# Patient Record
Sex: Female | Born: 1980 | Race: White | Hispanic: No | Smoking: Current every day smoker
Health system: Southern US, Community
[De-identification: ages and names within clinical notes are randomized; demographics above are authoritative.]

## PROBLEM LIST (undated history)

## (undated) ENCOUNTER — Inpatient Hospital Stay (HOSPITAL_COMMUNITY): Payer: Self-pay

## (undated) ENCOUNTER — Inpatient Hospital Stay (HOSPITAL_COMMUNITY): Payer: 59

## (undated) DIAGNOSIS — E039 Hypothyroidism, unspecified: Secondary | ICD-10-CM

## (undated) DIAGNOSIS — F419 Anxiety disorder, unspecified: Secondary | ICD-10-CM

## (undated) DIAGNOSIS — E079 Disorder of thyroid, unspecified: Secondary | ICD-10-CM

## (undated) DIAGNOSIS — K219 Gastro-esophageal reflux disease without esophagitis: Secondary | ICD-10-CM

## (undated) DIAGNOSIS — N760 Acute vaginitis: Secondary | ICD-10-CM

## (undated) DIAGNOSIS — F329 Major depressive disorder, single episode, unspecified: Secondary | ICD-10-CM

## (undated) DIAGNOSIS — C801 Malignant (primary) neoplasm, unspecified: Secondary | ICD-10-CM

## (undated) DIAGNOSIS — B9689 Other specified bacterial agents as the cause of diseases classified elsewhere: Secondary | ICD-10-CM

## (undated) DIAGNOSIS — R87619 Unspecified abnormal cytological findings in specimens from cervix uteri: Secondary | ICD-10-CM

## (undated) DIAGNOSIS — F32A Depression, unspecified: Secondary | ICD-10-CM

## (undated) DIAGNOSIS — J40 Bronchitis, not specified as acute or chronic: Secondary | ICD-10-CM

## (undated) HISTORY — PX: TONSILLECTOMY: SUR1361

## (undated) HISTORY — DX: Disorder of thyroid, unspecified: E07.9

## (undated) HISTORY — DX: Unspecified abnormal cytological findings in specimens from cervix uteri: R87.619

## (undated) HISTORY — PX: ABDOMINAL HYSTERECTOMY: SHX81

---
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
## 2002-10-20 ENCOUNTER — Inpatient Hospital Stay (HOSPITAL_COMMUNITY): Admission: AD | Admit: 2002-10-20 | Discharge: 2002-10-23 | Payer: Self-pay | Admitting: Obstetrics and Gynecology

## 2003-01-02 ENCOUNTER — Other Ambulatory Visit: Admission: RE | Admit: 2003-01-02 | Discharge: 2003-01-02 | Payer: Self-pay | Admitting: Obstetrics and Gynecology

## 2004-07-03 ENCOUNTER — Ambulatory Visit: Payer: Self-pay | Admitting: Internal Medicine

## 2004-07-11 ENCOUNTER — Ambulatory Visit: Payer: Self-pay | Admitting: Internal Medicine

## 2004-07-15 ENCOUNTER — Ambulatory Visit: Payer: Self-pay | Admitting: Nurse Practitioner

## 2004-07-16 ENCOUNTER — Ambulatory Visit: Payer: Self-pay | Admitting: *Deleted

## 2006-12-22 ENCOUNTER — Emergency Department (HOSPITAL_COMMUNITY): Admission: EM | Admit: 2006-12-22 | Discharge: 2006-12-22 | Payer: Self-pay | Admitting: Emergency Medicine

## 2006-12-27 ENCOUNTER — Encounter: Admission: RE | Admit: 2006-12-27 | Discharge: 2006-12-27 | Payer: Self-pay | Admitting: Chiropractic Medicine

## 2007-02-08 ENCOUNTER — Emergency Department (HOSPITAL_COMMUNITY): Admission: EM | Admit: 2007-02-08 | Discharge: 2007-02-08 | Payer: Self-pay | Admitting: Emergency Medicine

## 2009-06-04 DIAGNOSIS — R87619 Unspecified abnormal cytological findings in specimens from cervix uteri: Secondary | ICD-10-CM

## 2009-06-04 HISTORY — DX: Unspecified abnormal cytological findings in specimens from cervix uteri: R87.619

## 2009-06-04 HISTORY — PX: COLPOSCOPY: SHX161

## 2009-06-14 HISTORY — PX: INTRAUTERINE DEVICE INSERTION: SHX323

## 2010-08-22 NOTE — Op Note (Signed)
NAME:  Sara Shannon, Sara Shannon                        ACCOUNT NO.:  [id number]   MEDICAL RECORD NO.:  [id number]                   PATIENT TYPE:  INP   LOCATION:  9174                                 FACILITY:  [facility]   PHYSICIAN:  Hal Morales, M.D.             DATE OF BIRTH:  Apr 07, 1980   DATE OF PROCEDURE:  10/20/2002  DATE OF DISCHARGE:                                 OPERATIVE REPORT   PREOPERATIVE DIAGNOSES:  1. Intrauterine pregnancy at term.  2. Failure to progress in labor.  3. Arrest of the active phase of labor.   POSTOPERATIVE DIAGNOSES:  1. Intrauterine pregnancy at term.  2. Failure to progress in labor.  3. Arrest of the active phase of labor.   OPERATION/PROCEDURE:  Primary low transverse cesarean section.   SURGEON:  Hal Morales, M.D.   FIRST ASSISTANT:  Renaldo Reel. Emilee Hero, C.N.M.   ANESTHESIA:  Epidural.   ESTIMATED BLOOD LOSS:  250 mL.   COMPLICATIONS:  None.   FINDINGS:  The patient was delivered of a female infant whose name is  Jeri Modena, weighing 8 pounds 4 ounces with Apgars of 9 and 9 at one and five  minutes respectively.  The uterus, tubes and ovaries were normal for the  gravid state.  Discussion was held with the patient, father of the baby,  concerning the prolonged, active phase of labor with the patient having  remained at 8 cm for a number of hours in spite of adequate labor.  The  risks of anesthesia then explained in detail and the patient acknowledged  understanding of those risks and wished to proceed with cesarean section.   DESCRIPTION OF PROCEDURE:  The patient was placed in the supine position  with a left lateral tilt.  The abdomen was prepped with multiple layers of  Betadine and draped in the sterile field.  After assurance of adequate  anesthesia, a injection of 0.25% Marcaine for a total of 10 mL was  undertaken in the suprapubic region.  A suprapubic incision was made on the  abdomen, and opened in layers.  The peritoneum was  entered and the bladder  blade placed.  The uterus was incised approximately 2 cm above the  uterovesical folds and the infant delivered from the occiput transverse  position with the aid of a QV vacuum extractor.  The nares and pharynx was  suctioned and the cord clamped and cut.  The infant was handed off to the  awaiting pediatricians.  The appropriate cord blood was drawn.  The placenta  was noted to have separated from the uterus and was removed from the  operative field.  Uterine incision was closed with running interlocking  sutures of 0 Vicryl.  An imbricating suture of 0 Vicryl was placed.  Hemostasis was noted to be adequate.  Copious irrigation was carried out and  the abdominal peritoneum was closed with a running suture of 2-0 Vicryl.  The rectus muscles were reapproximated in the midline with a figure-of-eight  suture of 2-0 Vicryl.  The rectus muscles were made hemostatic with Bovie  cautery and irrigated.  The rectus fascia was closed with a running suture  of 0 Vicryl, then reinforced on either side of midline with figure-of-eight  sutures of 0 Vicryl.  The subcutaneous tissue was irrigated and made  hemostatic with Bovie cautery.  Skin staples were applied to the skin  incision.  A sterile dressing was applied.  The patient was taken from the  operating room to the recovery room in satisfactory condition having  tolerated the procedure well with sponge and instrument counts correct.  The  infant went to the full-term nursery.                                                  Hal Morales, M.D.    VPH/MEDQ  D:  10/20/2002  T:  10/22/2002  Job:  [id number]

## 2010-08-22 NOTE — H&P (Signed)
NAME:  Sara Shannon, Sara Shannon                        ACCOUNT NO.:  [id number]   MEDICAL RECORD NO.:  [id number]                   PATIENT TYPE:  INP   LOCATION:  9174                                 FACILITY:  [facility]   PHYSICIAN:  Hal Morales, M.D.             DATE OF BIRTH:  04/19/1980   DATE OF ADMISSION:  10/20/2002  DATE OF DISCHARGE:                                HISTORY & PHYSICAL   This is a 30 year old gravida 1, para 0, at 39-4/7 weeks who presents with  regular uterine contractions for two hours.  The cervix was 1 cm in the  office earlier today.  She denies leaking or bleeding and reports positive  fetal movement.  She did have some bleeding earlier today.  Pregnancy has  been followed by the nurse midwife service and remarkable for:   1. First trimester Chlamydia.  2. First trimester UTI.  3. History of questionable hypothyroidism.  4. Smoker.  5. Group B strep positive.    PAST OBSTETRICAL HISTORY:  The patient is a primigravida.   PRENATAL LABORATORY DATA:  Hemoglobin 13.1, platelets 253.  Blood type A  positive.  Antibody screen negative.  RPR nonreactive.  Rubella immune.  HBsAg negative.  HIV nonreactive.  Pap test normal.  Gonorrhea negative.  Chlamydia negative.  TSH within normal limits.  Glucose challenge within  normal limits.  Quad screen within normal limits.  Group B strep __________.   PAST MEDICAL HISTORY:  1. Remarkable for history of abnormal Pap smear in 2001 with normal repeat     Pap smears.  2. History of  STDs in the past.  3. Childhood Varicella.  4. Questionable history of hypothyroidism, for which she used Synthroid     while in seventh grade but then later stopped the drug and was informed     that her TSH was normal, so she has been off the medication since then.  5. She also has a history of frequent cystitis.  6. The patient is also a smoker of one pack per day.   PAST SURGICAL HISTORY:  Remarkable for tonsillectomy.   FAMILY  HISTORY:  Remarkable for emphysema in her grandmother, lung cancer in  her grandfather, a stroke in her father, alcohol use in her father.   GENETIC HISTORY:  Remarkable for father of the baby's great-uncle with  mental retardation.   SOCIAL HISTORY:  The patient is single but involved with [facility], who  is involved and supportive.  She is of the [employer].  She denies any  alcohol or drug use but does smoke one pack per day.   OBJECTIVE DATA:  VITAL SIGNS:  Stable, afebrile.  HEENT:  Within normal limits.  NECK:  Thyroid normal, not enlarged.  CHEST:  Clear to auscultation.  CARDIAC:  Regular rate and rhythm.  ABDOMEN:  Gravid at 40 cm vertex to Leopold's.  EFM:  She has a reassuring  fetal heart rate with uterine contractions every two to three minutes.  PELVIC:  Cervical exam 4, 90%, and -2 per R.N.  EXTREMITIES:  Within normal limits.   ASSESSMENT:  1. Intrauterine pregnancy at 39-4/7 weeks.  2. Active labor.   PLAN:  1. Admit to birthing suite, Dr. Pennie Rushing notified.  2. Routine CNM orders.  3. IV analgesia per patient request.     Elby Showers. Williams, C.N.M.                 Hal Morales, M.D.    MLW/MEDQ  D:  10/20/2002  T:  10/20/2002  Job:  [id number]

## 2010-08-22 NOTE — Discharge Summary (Signed)
NAME:  Sara Shannon, Sara Shannon                        ACCOUNT NO.:  [id number]   MEDICAL RECORD NO.:  [id number]                   PATIENT TYPE:  INP   LOCATION:  9123                                 FACILITY:  [facility]   PHYSICIAN:  Crist Fat. Rivard, M.D.              DATE OF BIRTH:  1980/10/13   DATE OF ADMISSION:  10/20/2002  DATE OF DISCHARGE:  10/23/2002                                 DISCHARGE SUMMARY   ADMISSION DIAGNOSES:  1. Intrauterine pregnancy at 21 and 4/7ths weeks.  2. Active labor.  3. Group B strep negative.   DISCHARGE DIAGNOSES:  1. Intrauterine pregnancy at 38 and 4/7ths weeks.  2. Active labor.  3. Group B strep negative.  4. Variable decelerations.  5. Failure to progress.   HOSPITAL PROCEDURES:  1. Epidural anesthesia.  2. Pitocin augmentation.  3. Amnioinfusion.  4. Primary low transverse cesarean section of a female infant names Jeri Modena     weighing 8 pounds, 4 ounces, Apgars 9/9.   HOSPITAL COURSE:  The patient was admitted in early active labor.  She  progressed throughout the day to 6 cm, and then an hour later to 7-8 cm.  After that time, her cervix remained at 8 cm for several more hours.  Later  that evening, the patient had experienced adequate labor with no change in  her cervix, which was 8 cm and 0 to +1 station with apparent asynchronism.  Risks and benefits of cesarean section were reviewed with the patient, and  the patient elected to proceed with a primary low transverse cesarean  section for failure to progress.  This was performed under epidural  anesthesia by Dr. Pennie Rushing with no complications.  Infant and mother were  taken to recovery and then to the mother baby unit.  On postoperative day  #1, the patient was doing well.  She did have some inability to void in the  first hours of day #1, which was relieved by forcing fluids and ending up  catheterization x1.  On postoperative day #2, she was doing much better, she  was voiding sufficiently,  she had two bowel movements, she was up and around  with no problems, vital signs were stable, and she continued her  postoperative care.  On postoperative day #3, she was requesting to go home,  vital signs were stable, and she was afebrile.  Chest clear to auscultation,  heart rate regular, abdomen was soft and appropriately tender, the incision  was clean, intact, and stable, extremities within normal limits, lochia  small to moderate.  She was deemed to have received full benefit of her  hospital stay and was discharged home.   DISCHARGE MEDICATIONS:  1. Motrin 600 mg p.o. q.6h. p.r.n. pain.  2. Tylox 1-2 p.o. q.4h. p.r.n. pain.    DISCHARGE LABORATORIES:  White blood cell count 13.8, hemoglobin 10.1,  platelets 138, RPR nonreactive.   DISCHARGE INSTRUCTIONS:  Received  OB handout.  Discharge follow up in six  weeks or p.r.n.     Marie L. Williams, C.N.M.                 Crist Fat Rivard, M.D.    MLW/MEDQ  D:  10/23/2002  T:  10/23/2002  Job:  [id number]

## 2011-01-13 LAB — DIFFERENTIAL
Basophils Absolute: 0
Eosinophils Absolute: 0.1
Eosinophils Relative: 1
Lymphocytes Relative: 14
Monocytes Absolute: 0.7
Monocytes Relative: 9

## 2011-01-13 LAB — I-STAT 8, (EC8 V) (CONVERTED LAB)
Hemoglobin: 15.6 — ABNORMAL HIGH
TCO2: 29

## 2011-01-13 LAB — CBC
HCT: 41.9
Hemoglobin: 14.3
MCHC: 34.2
MCV: 97.7
Platelets: 295
RBC: 4.29

## 2011-01-13 LAB — POCT I-STAT CREATININE: Creatinine, Ser: 1

## 2011-01-15 LAB — POCT URINALYSIS DIP (DEVICE)
Operator id: 116391
Protein, ur: >=300 — AB

## 2011-01-15 LAB — POCT PREGNANCY, URINE
Operator id: 116391
Preg Test, Ur: NEGATIVE

## 2013-10-07 ENCOUNTER — Encounter (HOSPITAL_COMMUNITY): Payer: Self-pay | Admitting: Emergency Medicine

## 2013-10-07 ENCOUNTER — Emergency Department (HOSPITAL_COMMUNITY): Payer: Self-pay

## 2013-10-07 ENCOUNTER — Emergency Department (HOSPITAL_COMMUNITY)
Admission: EM | Admit: 2013-10-07 | Discharge: 2013-10-08 | Disposition: A | Discharge status: Home / Self Care | Payer: Self-pay | Attending: Emergency Medicine | Admitting: Emergency Medicine

## 2013-10-07 DIAGNOSIS — Z3202 Encounter for pregnancy test, result negative: Secondary | ICD-10-CM | POA: Insufficient documentation

## 2013-10-07 DIAGNOSIS — F17200 Nicotine dependence, unspecified, uncomplicated: Secondary | ICD-10-CM | POA: Insufficient documentation

## 2013-10-07 DIAGNOSIS — J40 Bronchitis, not specified as acute or chronic: Secondary | ICD-10-CM

## 2013-10-07 DIAGNOSIS — Z792 Long term (current) use of antibiotics: Secondary | ICD-10-CM | POA: Insufficient documentation

## 2013-10-07 DIAGNOSIS — J209 Acute bronchitis, unspecified: Secondary | ICD-10-CM | POA: Insufficient documentation

## 2013-10-07 DIAGNOSIS — Z79899 Other long term (current) drug therapy: Secondary | ICD-10-CM | POA: Insufficient documentation

## 2013-10-07 DIAGNOSIS — J069 Acute upper respiratory infection, unspecified: Secondary | ICD-10-CM | POA: Insufficient documentation

## 2013-10-07 LAB — COMPREHENSIVE METABOLIC PANEL
ALBUMIN: 4 g/dL (ref 3.5–5.2)
ALT: 10 U/L (ref 0–35)
AST: 14 U/L (ref 0–37)
Alkaline Phosphatase: 47 U/L (ref 39–117)
Anion gap: 13 (ref 5–15)
BILIRUBIN TOTAL: 0.5 mg/dL (ref 0.3–1.2)
BUN: 11 mg/dL (ref 6–23)
CHLORIDE: 102 meq/L (ref 96–112)
CO2: 25 mEq/L (ref 19–32)
CREATININE: 0.89 mg/dL (ref 0.50–1.10)
Calcium: 9.1 mg/dL (ref 8.4–10.5)
GFR calc non Af Amer: 85 mL/min — ABNORMAL LOW (ref >90)
GLUCOSE: 102 mg/dL — AB (ref 70–99)
Potassium: 3.5 mEq/L — ABNORMAL LOW (ref 3.7–5.3)
SODIUM: 140 meq/L (ref 137–147)
TOTAL PROTEIN: 6.7 g/dL (ref 6.0–8.3)

## 2013-10-07 LAB — RAPID STREP SCREEN (MED CTR MEBANE ONLY): Streptococcus, Group A Screen (Direct): NEGATIVE

## 2013-10-07 LAB — URINALYSIS, ROUTINE W REFLEX MICROSCOPIC
Bilirubin Urine: NEGATIVE
GLUCOSE, UA: NEGATIVE mg/dL
Hgb urine dipstick: NEGATIVE
Ketones, ur: 15 mg/dL — AB
NITRITE: NEGATIVE
Protein, ur: NEGATIVE mg/dL
Specific Gravity, Urine: 1.02 (ref 1.005–1.030)
UROBILINOGEN UA: 1 mg/dL (ref 0.0–1.0)
pH: 6 (ref 5.0–8.0)

## 2013-10-07 LAB — CBC WITH DIFFERENTIAL/PLATELET
BASOS ABS: 0 10*3/uL (ref 0.0–0.1)
Basophils Relative: 0 % (ref 0–1)
EOS ABS: 0.2 10*3/uL (ref 0.0–0.7)
EOS PCT: 2 % (ref 0–5)
HCT: 40.1 % (ref 36.0–46.0)
Hemoglobin: 13.9 g/dL (ref 12.0–15.0)
Lymphocytes Relative: 26 % (ref 12–46)
Lymphs Abs: 2.3 10*3/uL (ref 0.7–4.0)
MCH: 33.4 pg (ref 26.0–34.0)
MCHC: 34.7 g/dL (ref 30.0–36.0)
MCV: 96.4 fL (ref 78.0–100.0)
MONO ABS: 0.6 10*3/uL (ref 0.1–1.0)
Monocytes Relative: 7 % (ref 3–12)
NEUTROS ABS: 5.6 10*3/uL (ref 1.7–7.7)
NEUTROS PCT: 65 % (ref 43–77)
PLATELETS: 226 10*3/uL (ref 150–400)
RBC: 4.16 MIL/uL (ref 3.87–5.11)
RDW: 13.2 % (ref 11.5–15.5)
WBC: 8.6 10*3/uL (ref 4.0–10.5)

## 2013-10-07 LAB — I-STAT TROPONIN, ED: Troponin i, poc: 0 ng/mL (ref 0.00–0.08)

## 2013-10-07 LAB — URINE MICROSCOPIC-ADD ON

## 2013-10-07 LAB — POC URINE PREG, ED: Preg Test, Ur: NEGATIVE

## 2013-10-07 MED ORDER — ALBUTEROL SULFATE HFA 108 (90 BASE) MCG/ACT IN AERS
2.0000 | INHALATION_SPRAY | Freq: Once | RESPIRATORY_TRACT | Status: AC
  Administered 2013-10-07: 2 via RESPIRATORY_TRACT
  Filled 2013-10-07: qty 6.7

## 2013-10-07 MED ORDER — SODIUM CHLORIDE 0.9 % IV BOLUS (SEPSIS)
1000.0000 mL | Freq: Once | INTRAVENOUS | Status: AC
  Administered 2013-10-07: 1000 mL via INTRAVENOUS

## 2013-10-07 MED ORDER — AZITHROMYCIN 250 MG PO TABS: 250.0000 mg | ORAL_TABLET | Freq: Every day | ORAL | Status: DC

## 2013-10-07 NOTE — ED Notes (Signed)
C/o sore throat, aching and bilateral aching in ear.  Denies n/v/d

## 2013-10-07 NOTE — ED Provider Notes (Signed)
CSN: [id number]     Arrival date & time 10/07/13  1913 History   First MD Initiated Contact with Patient 10/07/13 1928     Chief Complaint  Patient presents with  . Sore Throat  . Fever     (Consider location/radiation/quality/duration/timing/severity/associated sxs/prior Treatment) The history is provided by the patient. No language interpreter was used.  Sara Shannon is a 33 year old female with past medical history of tonsillectomy presenting to the ED with sore throat, generalized bodyaches, chills been ongoing since yesterday. Patient reports that when she swallows it feels as if she is swallowing razor blades. Stated that she has discomfort radiating to bilateral ears. Reported that she's been having a dry cough starting yesterday along with chest tightness associated with this cough. Patient reported that her husband had similar symptoms a couple of days ago. Stated that recently she switched to third shift and stated that when she does not get enough sleep she normally gets some type of stiffness. Stated he's been using naproxen with minimally. Denied fever, nausea, vomiting, abdominal pain, blurred vision, sudden loss of vision, numbness tingling, chest pain, shortness of breath, difficulty breathing. PCP none  History reviewed. No pertinent past medical history. Past Surgical History  Procedure Laterality Date  . Tonsillectomy     History reviewed. No pertinent family history. History  Substance Use Topics  . Smoking status: Current Every Day Smoker -- 0.50 packs/day    Types: Cigarettes  . Smokeless tobacco: Never Used  . Alcohol Use: No   OB History   Grav Para Term Preterm Abortions TAB SAB Ect Mult Living                 Review of Systems  Constitutional: Positive for chills. Negative for fever.  HENT: Positive for sore throat.   Eyes: Negative for visual disturbance.  Respiratory: Positive for chest tightness. Negative for shortness of breath.   Cardiovascular:  Negative for chest pain.  Neurological: Negative for dizziness, weakness and headaches.      Allergies  Review of patient's allergies indicates no known allergies.  Home Medications   Prior to Admission medications   Medication Sig Start Date End Date Taking? Authorizing Provider  DM-Benzocaine-Menthol [facility] TOTAL MT) Use as directed 1 tablet in the mouth or throat as needed (for sore throat).   Yes Historical Provider, MD  Multiple Vitamins-Minerals (EMERGEN-C VITAMIN C PO) Take 1 Package by mouth daily.   Yes Historical Provider, MD  naproxen (NAPROSYN) 500 MG tablet Take 500 mg by mouth daily as needed for mild pain.   Yes Historical Provider, MD  azithromycin (ZITHROMAX) 250 MG tablet Take 1 tablet (250 mg total) by mouth daily. Take first 2 tablets together, then 1 every day until finished. 10/07/13   Aella Ronda, PA-C   BP 100/56  Pulse 62  Temp(Src) 98.3 F (36.8 C) (Oral)  Resp 18  Ht 5\' 5"  (1.651 m)  Wt 165 lb (74.844 kg)  BMI 27.46 kg/m2  SpO2 98% Physical Exam  Nursing note and vitals reviewed. Constitutional: She is oriented to person, place, and time. She appears well-developed and well-nourished. No distress.  HENT:  Head: Normocephalic and atraumatic.  Mouth/Throat: Posterior oropharyngeal edema and posterior oropharyngeal erythema present. No oropharyngeal exudate or tonsillar abscesses.  Tonsils not present. Posterior oropharynx swelling, erythema identified with negative pustules or exudate noted. Negative petechiae identified to the soft palate. Negative trismus.  Eyes: Conjunctivae and EOM are normal. Pupils are equal, round, and reactive to light. Right  eye exhibits no discharge. Left eye exhibits no discharge.  Neck: Normal range of motion. Neck supple. No tracheal deviation present.  Negative neck stiffness Negative nuchal rigidity Negative cervical lymphadenopathy Negative meningeal signs  Cardiovascular: Normal rate, regular rhythm and normal  heart sounds.  Exam reveals no friction rub.   No murmur heard. Cap refill less than 3 seconds Negative swelling or pitting edema identified to the lower extremities bilaterally  Pulmonary/Chest: Effort normal and breath sounds normal. No respiratory distress. She has no wheezes. She has no rales.  Patient is able to speak in full sentences without difficulty Negative use of accessory muscles Negative stridor  Musculoskeletal: Normal range of motion.  Full ROM to upper and lower extremities without difficulty noted, negative ataxia noted.  Lymphadenopathy:    She has no cervical adenopathy.  Neurological: She is alert and oriented to person, place, and time. No cranial nerve deficit. She exhibits normal muscle tone. Coordination normal.  Cranial nerves III-XII grossly intact Strength 5+/5+ to upper and lower extremities bilaterally with resistance applied, equal distribution noted Equal grip strength bilaterally Negative facial drooping Negative slurred speech Negative aphasia Gait proper, proper balance - negative sway, negative drift, negative step-offs  Skin: Skin is warm and dry. No rash noted. She is not diaphoretic. No erythema.  Psychiatric: She has a normal mood and affect. Her behavior is normal. Thought content normal.    ED Course  Procedures (including critical care time)  Results for orders placed during the hospital encounter of 10/07/13  RAPID STREP SCREEN      Result Value Ref Range   Streptococcus, Group A Screen (Direct) NEGATIVE  NEGATIVE  CBC WITH DIFFERENTIAL      Result Value Ref Range   WBC 8.6  4.0 - 10.5 K/uL   RBC 4.16  3.87 - 5.11 MIL/uL   Hemoglobin 13.9  12.0 - 15.0 g/dL   HCT 40.1  36.0 - 46.0 %   MCV 96.4  78.0 - 100.0 fL   MCH 33.4  26.0 - 34.0 pg   MCHC 34.7  30.0 - 36.0 g/dL   RDW 13.2  11.5 - 15.5 %   Platelets 226  150 - 400 K/uL   Neutrophils Relative % 65  43 - 77 %   Neutro Abs 5.6  1.7 - 7.7 K/uL   Lymphocytes Relative 26  12 - 46 %    Lymphs Abs 2.3  0.7 - 4.0 K/uL   Monocytes Relative 7  3 - 12 %   Monocytes Absolute 0.6  0.1 - 1.0 K/uL   Eosinophils Relative 2  0 - 5 %   Eosinophils Absolute 0.2  0.0 - 0.7 K/uL   Basophils Relative 0  0 - 1 %   Basophils Absolute 0.0  0.0 - 0.1 K/uL  COMPREHENSIVE METABOLIC PANEL      Result Value Ref Range   Sodium 140  137 - 147 mEq/L   Potassium 3.5 (*) 3.7 - 5.3 mEq/L   Chloride 102  96 - 112 mEq/L   CO2 25  19 - 32 mEq/L   Glucose, Bld 102 (*) 70 - 99 mg/dL   BUN 11  6 - 23 mg/dL   Creatinine, Ser 0.89  0.50 - 1.10 mg/dL   Calcium 9.1  8.4 - 10.5 mg/dL   Total Protein 6.7  6.0 - 8.3 g/dL   Albumin 4.0  3.5 - 5.2 g/dL   AST 14  0 - 37 U/L   ALT 10  0 -  35 U/L   Alkaline Phosphatase 47  39 - 117 U/L   Total Bilirubin 0.5  0.3 - 1.2 mg/dL   GFR calc non Af Amer 85 (*) >90 mL/min   GFR calc Af Amer >90  >90 mL/min   Anion gap 13  5 - 15  URINALYSIS, ROUTINE W REFLEX MICROSCOPIC      Result Value Ref Range   Color, Urine YELLOW  YELLOW   APPearance HAZY (*) CLEAR   Specific Gravity, Urine 1.020  1.005 - 1.030   pH 6.0  5.0 - 8.0   Glucose, UA NEGATIVE  NEGATIVE mg/dL   Hgb urine dipstick NEGATIVE  NEGATIVE   Bilirubin Urine NEGATIVE  NEGATIVE   Ketones, ur 15 (*) NEGATIVE mg/dL   Protein, ur NEGATIVE  NEGATIVE mg/dL   Urobilinogen, UA 1.0  0.0 - 1.0 mg/dL   Nitrite NEGATIVE  NEGATIVE   Leukocytes, UA MODERATE (*) NEGATIVE  URINE MICROSCOPIC-ADD ON      Result Value Ref Range   Squamous Epithelial / LPF MANY (*) RARE   WBC, UA 0-2  <3 WBC/hpf   Bacteria, UA MANY (*) RARE   Urine-Other AMORPHOUS URATES/PHOSPHATES    POC URINE PREG, ED      Result Value Ref Range   Preg Test, Ur NEGATIVE  NEGATIVE  I-STAT TROPOININ, ED      Result Value Ref Range   Troponin i, poc 0.00  0.00 - 0.08 ng/mL   Comment 3             Labs Review Labs Reviewed  COMPREHENSIVE METABOLIC PANEL - Abnormal; Notable for the following:    Potassium 3.5 (*)    Glucose, Bld 102 (*)     GFR calc non Af Amer 85 (*)    All other components within normal limits  URINALYSIS, ROUTINE W REFLEX MICROSCOPIC - Abnormal; Notable for the following:    APPearance HAZY (*)    Ketones, ur 15 (*)    Leukocytes, UA MODERATE (*)    All other components within normal limits  URINE MICROSCOPIC-ADD ON - Abnormal; Notable for the following:    Squamous Epithelial / LPF MANY (*)    Bacteria, UA MANY (*)    All other components within normal limits  RAPID STREP SCREEN  CULTURE, GROUP A STREP  CBC WITH DIFFERENTIAL  POC URINE PREG, ED  Randolm Idol, ED    Imaging Review Dg Chest 2 View  10/07/2013   CLINICAL DATA:  Sore throat, headache, chills.  EXAM: CHEST  2 VIEW  COMPARISON:  None.  FINDINGS: The heart size and mediastinal contours are within normal limits. Both lungs are clear. The visualized skeletal structures are unremarkable.  IMPRESSION: No active cardiopulmonary disease.   Electronically Signed   By: Rolm Baptise M.D.   On: 10/07/2013 21:40     EKG Interpretation None      MDM   Final diagnoses:  URI (upper respiratory infection)  Bronchitis    Medications  albuterol (PROVENTIL HFA;VENTOLIN HFA) 108 (90 BASE) MCG/ACT inhaler 2 puff (not administered)  sodium chloride 0.9 % bolus 1,000 mL (1,000 mLs Intravenous New Bag/Given 10/07/13 2141)   Filed Vitals:   10/07/13 2226 10/07/13 2230 10/07/13 2245 10/07/13 2300  BP: 102/59 104/58 102/59 100/56  Pulse: 72 63 65 62  Temp:      TempSrc:      Resp: 18 23 14 18   Height:      Weight:      SpO2:  99% 98% 99% 98%   EKG normal sinus rhythm a heart rate of 61 beats per minute. I-STAT troponin negative elevation. CBC negative elevated white blood cell count-negative left shift or leukocytosis noted. CMP negative findings-mildly low potassium of 3.5. Urinalysis negative for infection-negative nitrites. Moderate leukocytes identified with negative elevated white blood cells. Many squamous cells as well as many bacteria  noted. Urine pregnancy negative. Rapid strep negative. Chest x-ray negative for acute cardiopulmonary disease. Doubt streptococcal pharyngitis. Doubt tonsillitis-patient has bilateral tonsillectomy. Doubt peritonsillar abscess. Doubt retropharyngeal abscess. Suspicion to be upper respiratory infection, cannot rule out possible bronchitis. Patient stable, afebrile. Patient not septic appearing. Discharged patient with antibiotics and albuterol inhaler. [facility]. Discussed with patient to rest and stay hydrated. Discussed with patient to closely monitor symptoms and if symptoms are to worsen or change to report back to the ED - strict return instructions given.  Patient agreed to plan of care, understood, all questions answered.   Jamse Mead, PA-C 10/08/13 [phone]

## 2013-10-07 NOTE — ED Provider Notes (Signed)
Date: 10/07/2013  Rate: 61  Rhythm: normal sinus rhythm  QRS Axis: normal  Intervals: normal  ST/T Wave abnormalities: RSR' V1, posible RVH  Conduction Disutrbances:none  Narrative Interpretation:   Old EKG Reviewed: none available  Medical screening examination/treatment/procedure(s) were performed by non-physician practitioner and as supervising physician I was immediately available for consultation/collaboration.   EKG Interpretation None        Neta Ehlers, MD 10/08/13 1140

## 2013-10-07 NOTE — Discharge Instructions (Signed)
Please call your doctor for a followup appointment within 24-48 hours. When you talk to your doctor please let them know that you were seen in the emergency department and have them acquire all of your records so that they can discuss the findings with you and formulate a treatment plan to fully care for your new and ongoing problems. Please call and set-up an appointment to be seen and reassessed Please rest and stay hydrated Please use antibiotics as prescribed - please take on a full stomach  Please use albuterol as needed for shortness of breath Please continue monitor symptoms closely and if symptoms are to worsen or change (fever greater than 101, chills, chest pain, shortness of breath, difficulty breathing, numbness, tingling, stomach pain, weakness, fainting, blurred vision, sudden loss of vision) please report back to the ED immediately   Bronchitis Bronchitis is inflammation of the airways that extend from the windpipe into the lungs (bronchi). The inflammation often causes mucus to develop, which leads to a cough. If the inflammation becomes severe, it may cause shortness of breath. CAUSES  Bronchitis may be caused by:   Viral infections.   Bacteria.   Cigarette smoke.   Allergens, pollutants, and other irritants.  SIGNS AND SYMPTOMS  The most common symptom of bronchitis is a frequent cough that produces mucus. Other symptoms include:  Fever.   Body aches.   Chest congestion.   Chills.   Shortness of breath.   Sore throat.  DIAGNOSIS  Bronchitis is usually diagnosed through a medical history and physical exam. Tests, such as chest X-rays, are sometimes done to rule out other conditions.  TREATMENT  You may need to avoid contact with whatever caused the problem (smoking, for example). Medicines are sometimes needed. These may include:  Antibiotics. These may be prescribed if the condition is caused by bacteria.  Cough suppressants. These may be  prescribed for relief of cough symptoms.   Inhaled medicines. These may be prescribed to help open your airways and make it easier for you to breathe.   Steroid medicines. These may be prescribed for those with recurrent (chronic) bronchitis. HOME CARE INSTRUCTIONS  Get plenty of rest.   Drink enough fluids to keep your urine clear or pale yellow (unless you have a medical condition that requires fluid restriction). Increasing fluids may help thin your secretions and will prevent dehydration.   Only take over-the-counter or prescription medicines as directed by your health care provider.  Only take antibiotics as directed. Make sure you finish them even if you start to feel better.  Avoid secondhand smoke, irritating chemicals, and strong fumes. These will make bronchitis worse. If you are a smoker, quit smoking. Consider using nicotine gum or skin patches to help control withdrawal symptoms. Quitting smoking will help your lungs heal faster.   Put a cool-mist humidifier in your bedroom at night to moisten the air. This may help loosen mucus. Change the water in the humidifier daily. You can also run the hot water in your shower and sit in the bathroom with the door closed for 5-10 minutes.   Follow up with your health care provider as directed.   Wash your hands frequently to avoid catching bronchitis again or spreading an infection to others.  SEEK MEDICAL CARE IF: Your symptoms do not improve after 1 week of treatment.  SEEK IMMEDIATE MEDICAL CARE IF:  Your fever increases.  You have chills.   You have chest pain.   You have worsening shortness of breath.  You have bloody sputum.  You faint.  You have lightheadedness.  You have a severe headache.   You vomit repeatedly. MAKE SURE YOU:   Understand these instructions.  Will watch your condition.  Will get help right away if you are not doing well or get worse. Document Released: 03/23/2005 Document  Revised: 01/11/2013 Document Reviewed: 11/15/2012 [facility] Patient Information 2015 [place], [place]. This information is not intended to replace advice given to you by your health care provider. Make sure you discuss any questions you have with your health care provider.   Emergency Department Resource Guide 1) Find a Doctor and Pay Out of Pocket Although you won't have to find out who is covered by your insurance plan, it is a good idea to ask around and get recommendations. You will then need to call the office and see if the doctor you have chosen will accept you as a new patient and what types of options they offer for patients who are self-pay. Some doctors offer discounts or will set up payment plans for their patients who do not have insurance, but you will need to ask so you aren't surprised when you get to your appointment.  2) Contact Your Local Health Department Not all health departments have doctors that can see patients for sick visits, but many do, so it is worth a call to see if yours does. If you don't know where your local health department is, you can check in your phone book. The CDC also has a tool to help you locate your state's health department, and many state websites also have listings of all of their local health departments.  3) Find a [facility] If your illness is not likely to be very severe or complicated, you may want to try a walk in clinic. These are popping up all over the country in pharmacies, drugstores, and shopping centers. They're usually staffed by nurse practitioners or physician assistants that have been trained to treat common illnesses and complaints. They're usually fairly quick and inexpensive. However, if you have serious medical issues or chronic medical problems, these are probably not your best option.  No Primary Care Doctor: - Call Health Connect at  [phone] - they can help you locate a primary care doctor that  accepts your insurance,  provides certain services, etc. - Physician Referral Service- [phone]  Chronic Pain Problems: Organization         Address  Phone   Notes  [facility]  [phone] Patients need to be referred by their primary care doctor.   Medication Assistance: Organization         Address  Phone   Notes  [facility] Medication [facility] [address] [place], [place] [address] [phone] --Must be a resident of [facility] -- Must have NO insurance coverage whatsoever (no Medicaid/ Medicare, etc.) -- The pt. MUST have a primary care doctor that directs their care regularly and follows them in the community   MedAssist  [phone]   [employer]  [phone]    Agencies that provide inexpensive medical care: Organization         Address  Phone   Notes  [place]  [phone]   [facility]    [phone]   [facility] [place], [place] [address] [phone]   [place] [address], [place] [phone]  Planned Parenthood    [phone]   [facility]    [phone]   Community Health and [place] [address], [place] Phone:  [phone], Fax:  [phone] Hours of Operation:  9 am - 6 pm, M-F.  Also accepts Medicaid/Medicare and self-pay.  [facility] for [place] [address], [address], [place] Phone: [phone], Fax: [phone]. Hours of Operation:  8:30 am - 5:30 pm, M-F.  Also accepts Medicaid and self-pay.  [facility] [address] [place] Phone: [phone]   [place], [place], [place] [phone], Ext. [phone] Mondays & Thursdays: 7-9 AM.  First 15 patients are seen on a first come, first serve basis.    [place] Providers:  Organization         Address  Phone    Notes  [facility] [address], [place] [phone] Also accepts self-pay patients.  [facility] [address], [place]  [phone]   [place], [address], [place] [phone]   [facility] [address] [place] [phone]   Lucianne Lei [address], [place]   [phone] Only accepts [place] Access [place] patients after they have their name applied to their card.   Self-Pay (no insurance) in [facility]:  Organization         Address  Phone   Notes  Sickle Cell Patients, [facility] Internal Medicine [place] [phone]   [facility] [place] [phone]   [facility] [place]  [place], [place], [place] [phone]   [facility]/Dr. Osei-Bonsu  [address] [place] or [address], [place] [phone] Phone number for both [place] and [place] locations is the same.  Urgent Medical and [facility] [address], [place]  [phone]   [facility] [address] [place] or [address] [phone]   [facility] [address] [place] [phone], phone; [phone], fax Sees patients 1st and 3rd Saturday of every month.  Must not qualify for public or private insurance (i.e. Medicaid, Medicare, [place] Health Choice, Veterans' Benefits)  Household income should be no more than 200% of the poverty level The clinic cannot treat you if you are pregnant or think you are pregnant  Sexually transmitted diseases are not treated at the clinic.    Dental Care: Organization         Address  Phone  Notes  [facility] Department of [facility] [phone] Accepts children up to age 67 who are enrolled in [place] or [place]; pregnant women with a Medicaid card; and children who have applied for Medicaid or [place] Health Choice, but were declined, whose parents can pay a reduced fee at time of service.  [facility] Department of [facility]  [address], [place] [phone] Accepts children up to age 71 who are enrolled in [place] or [place]; pregnant women with a Medicaid card; and children who have applied for Medicaid or [place], but were declined,  whose parents can pay a reduced fee at time of service.  [place] Adult [facility] Access PROGRAM  [place] [phone] Patients are seen by appointment only. Walk-ins are not accepted. [place] will see patients 91 years of age and older. Monday - Tuesday (8am-5pm) Most Wednesdays (8:30-5pm) $30 per visit, cash only  [facility] Adult Dental Access PROGRAM  [address], [facility] [phone] Patients are seen by appointment only. Walk-ins are not accepted. [place] will see patients 79 years of age and older. One Wednesday Evening (Monthly: Volunteer Based).  $30 per visit, cash only  [place]  [phone] for adults; Children under age 45, call Graduate Pediatric Dentistry at [phone]. Children aged 20-14, please call [phone] to request a pediatric application.  Dental services are provided in all areas of dental care including fillings, crowns and bridges, complete and partial dentures, implants, gum treatment, root canals, and extractions. Preventive care is also provided. Treatment is provided to both adults and children. Patients are selected via a lottery and there is often a waiting list.   [facility] [address] [place]  [phone] [email or web address]   [facility] [address] [place], [place] [phone], Ext.  123 Second and Fourth Thursday of each month, opens at 6:30 AM; Clinic ends at 9 AM.  Patients are seen on a first-come first-served basis, and a limited number are seen during each clinic.   [facility]  [address] [place] Danker [place], [place] [phone]   Eligibility Requirements You must have lived in [place], [place], or [place] counties for at least the last three months.   You cannot be eligible for state or federal sponsored [employer], including [employer], [place], or [employer].   You generally cannot be eligible for healthcare insurance through your employer.    How to apply: Eligibility screenings are held every Tuesday and Wednesday afternoon from 1:00 pm until 4:00 pm. You do not need an appointment for the interview!  [facility] [address], [place], [place]   [place]  [place] Department  [place]  [phone]    Behavioral Health Resources in the Community: Intensive Outpatient Programs Organization         Address  Phone  Notes  [place]. [address] [place], [place] [phone]   [facility] Outpatient [address] [place], [place]   ADS: Alcohol & Drug Svcs [address] [place], [place]   [place] [address]  [place], [place] or [phone]   Substance Abuse Resources Organization         Address  Phone  Notes  Alcohol and Drug Services  [phone]   [place]  [phone]   [facility] [place]   Chinita Pester  [phone]   Residential & Outpatient Substance Abuse Program  [phone]   Psychological Services Organization         Address  Phone  Notes  [facility] [place]  [place]  [phone]   [place] [address] [place] or [phone]    Mobile Crisis Teams Organization         Address  Phone  Notes  [facility]atives, Mobile  Crisis Care Unit  [phone]   Assertive Psychotherapeutic Services  [address] [place], [place]   [facility] [address] [place] [phone]    Self-Help/Support Groups Organization         Address  Phone             Notes  [place]. of [place] - variety of support groups  [place] Call for more information  Narcotics Anonymous (NA), Caring [facility] [address], [employer] [place]  2 meetings at this location   Special educational needs teacher         Address  Phone  Notes  ASAP Residential Treatment [place],    [place]  [phone]   [facility]  [address], [place] [phone], [place], [place]   [place], [place] [phone] Admissions: 8am-3pm M-F  Incentives Substance [place] [address],    [place], [place] [phone]   [facility] [address] [place], [place], [place]   [facility] [address]  [place], [place]   Insight Programs - Intensive Outpatient [address], [place], [place]   [facility] [address]  [place], [place] [phone] or [phone]   [facility]idential Treatment Services (RTS) [address] [place], [place] Accepts Medicaid  [place] [address]  [place] [phone] Substance Abuse/Addiction Treatment   [facility] Organization         Address  Phone  Notes  [facility]  [phone]   Domenic Schwab, PhD [address] Arlis [place], [place]   [phone] or [phone]   Media [place]  [place], [place] [phone]   [facility] [address], [place], [place] [phone] Insurance/Medicaid/sponsorship through [facility] and Families [address], [address] [place]                                    [place], [place] [phone] [place] [address], [place] [phone]    Dr. Adele Schilder  [phone]   [facility] Dept. 1) [address], [place] 2) [place] 3)  [place] [address], [place] [phone]  [phone]   [place] [phone] or [phone] (After Hours)

## 2013-10-07 NOTE — ED Notes (Signed)
Patient also states her neck is sore

## 2013-10-07 NOTE — ED Notes (Signed)
Patient states she started with a sore throat and ears hurting on Friday morning.  +cough  "feels like bronchitis is coming on". Decreased appetite

## 2013-10-09 LAB — CULTURE, GROUP A STREP

## 2014-05-17 ENCOUNTER — Encounter: Payer: Self-pay | Admitting: Nurse Practitioner

## 2014-05-17 ENCOUNTER — Ambulatory Visit (INDEPENDENT_AMBULATORY_CARE_PROVIDER_SITE_OTHER): Payer: 59 | Admitting: Nurse Practitioner

## 2014-05-17 VITALS — BP 116/76 | HR 72 | Ht 64.75 in | Wt 164.0 lb

## 2014-05-17 DIAGNOSIS — R87613 High grade squamous intraepithelial lesion on cytologic smear of cervix (HGSIL): Secondary | ICD-10-CM

## 2014-05-17 DIAGNOSIS — R87618 Other abnormal cytological findings on specimens from cervix uteri: Secondary | ICD-10-CM

## 2014-05-17 DIAGNOSIS — Z0000 Encounter for general adult medical examination without abnormal findings: Secondary | ICD-10-CM

## 2014-05-17 DIAGNOSIS — R8290 Unspecified abnormal findings in urine: Secondary | ICD-10-CM

## 2014-05-17 DIAGNOSIS — N9489 Other specified conditions associated with female genital organs and menstrual cycle: Secondary | ICD-10-CM

## 2014-05-17 DIAGNOSIS — N898 Other specified noninflammatory disorders of vagina: Secondary | ICD-10-CM

## 2014-05-17 DIAGNOSIS — Z01419 Encounter for gynecological examination (general) (routine) without abnormal findings: Secondary | ICD-10-CM

## 2014-05-17 DIAGNOSIS — E559 Vitamin D deficiency, unspecified: Secondary | ICD-10-CM

## 2014-05-17 DIAGNOSIS — Z975 Presence of (intrauterine) contraceptive device: Secondary | ICD-10-CM

## 2014-05-17 LAB — COMPREHENSIVE METABOLIC PANEL
ALT: 8 U/L (ref 0–35)
AST: 12 U/L (ref 0–37)
Albumin: 3.9 g/dL (ref 3.5–5.2)
Alkaline Phosphatase: 49 U/L (ref 39–117)
BUN: 10 mg/dL (ref 6–23)
CALCIUM: 8.8 mg/dL (ref 8.4–10.5)
CO2: 28 meq/L (ref 19–32)
Chloride: 105 mEq/L (ref 96–112)
Creat: 0.83 mg/dL (ref 0.50–1.10)
Glucose, Bld: 74 mg/dL (ref 70–99)
Potassium: 4.1 mEq/L (ref 3.5–5.3)
SODIUM: 140 meq/L (ref 135–145)
TOTAL PROTEIN: 6.7 g/dL (ref 6.0–8.3)
Total Bilirubin: 0.4 mg/dL (ref 0.2–1.2)

## 2014-05-17 LAB — HEMOGLOBIN, FINGERSTICK: Hemoglobin, fingerstick: 14.6 g/dL (ref 12.0–16.0)

## 2014-05-17 LAB — LIPID PANEL
CHOLESTEROL: 171 mg/dL (ref 0–200)
HDL: 44 mg/dL (ref >39)
LDL Cholesterol: 114 mg/dL — ABNORMAL HIGH (ref 0–99)
TRIGLYCERIDES: 67 mg/dL (ref <150)
Total CHOL/HDL Ratio: 3.9 Ratio
VLDL: 13 mg/dL (ref 0–40)

## 2014-05-17 LAB — POCT URINALYSIS DIPSTICK
Bilirubin, UA: NEGATIVE
Blood, UA: NEGATIVE
GLUCOSE UA: NEGATIVE
Ketones, UA: NEGATIVE
Nitrite, UA: NEGATIVE
Protein, UA: NEGATIVE
UROBILINOGEN UA: NEGATIVE
pH, UA: 5

## 2014-05-17 NOTE — Patient Instructions (Signed)

## 2014-05-17 NOTE — Progress Notes (Signed)
34 y.o. G97P1001 Married  Caucasian Fe here for [place] annual exam. She has several concerns today.   Mirena IUD inserted 06/14/2009.  Wants to expand her family and wants removal of IUD.  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  She is now working with [employer] and must wear protective gear all the time.  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  She has had a lapse of insurance coverage and was unable to get GYN care and chose not to go to Health Dept.  Now very concerned about her pap results.  She is also having fatigue and with history of hypothyroid as a young teen.  After her first pregnancy 11 years ago she was taken off thyroid med's.  She feels she needs them again.  She is also having a vaginal odor that is intermittent.  Usually has a light vaginal discharge daily.   Patient's last menstrual period was 06/14/2009 (exact date).          Sexually active: Yes.    The current method of family planning is IUD.    Exercising: Yes.    walking  Smoker:  Yes, 1/2 ppd  Health Maintenance: Pap:  06/2009, abnormal, "precancerous cells" on colpo, never repeated TDaP:  09/2013 ? Labs:  HB:  14.6  Urine:  Trace leuk's   reports that she has been smoking Cigarettes.  She has been smoking about 0.50 packs per day. She has never used smokeless tobacco. She reports that she does not drink alcohol or use illicit drugs.  Past Medical History  Diagnosis Date  . Abnormal Pap smear of cervix 06/2009    pos HR HPV with colpo  . Thyroid disease     middle school, returned to normal after childbirth    Past Surgical History  Procedure Laterality Date  . Tonsillectomy and adenoidectomy  1992  . Cesarean section  10/20/02  . Intrauterine device insertion  06/14/09    Mirena  . Colposcopy   06/2009    "precancerous cells" on biopsy    Current Outpatient Prescriptions  Medication Sig Dispense Refill  . levonorgestrel (MIRENA) 20 MCG/24HR IUD 1 each by Intrauterine route once.    . naproxen (NAPROSYN) 500 MG tablet Take 500 mg by mouth daily as needed for mild pain.     No current facility-administered medications for this visit.    Family History  Problem Relation Age of Onset  . Stroke Father 61    blocked carotid artery, left side  . Cancer Maternal Grandmother     lung, smoker  . Emphysema Maternal Grandmother     smoker  . Stroke Maternal Grandfather 48  . Dementia Paternal Grandmother   . Cancer Paternal Grandfather     lung, smoker?    ROS:  Pertinent items are noted in HPI.  Otherwise, a comprehensive ROS was negative.  Exam:   BP 116/76 mmHg  Pulse 72  Ht 5' 4.75" (1.645 m)  Wt 164 lb (74.39 kg)  BMI 27.49 kg/m2  LMP 06/14/2009 (Exact Date) Height: 5' 4.75" (164.5 cm) Ht Readings from Last 3 Encounters:  05/17/14 5' 4.75" (1.645 m)  10/07/13 5\' 5"  (1.651 m)    General appearance: alert, cooperative and appears  stated age Head: Normocephalic, without obvious abnormality, atraumatic Neck: no adenopathy, supple, symmetrical, trachea midline and thyroid normal to inspection and palpation Lungs: clear to auscultation bilaterally Breasts: normal appearance, no masses or tenderness Heart: regular rate and rhythm Abdomen: soft, non-tender; no masses,  no organomegaly Extremities: extremities normal, atraumatic, no cyanosis or edema Skin: Skin color, texture, turgor normal. No rashes or lesions Lymph nodes: Cervical, supraclavicular, and axillary nodes normal. No abnormal inguinal nodes palpated Neurologic: Grossly normal   Pelvic: External genitalia:  no lesions              Urethra:  normal appearing urethra with no masses, tenderness or lesions              Bartholin's and Skene's: normal                 Vagina: normal appearing vagina with  normal color and light yellowish discharge, no lesions Affirm test is done              Cervix: anteverted IUD strings are visible.  No obvious abnormality              Pap taken: Yes.   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

## 2014-05-18 ENCOUNTER — Other Ambulatory Visit: Payer: Self-pay | Admitting: Certified Nurse Midwife

## 2014-05-18 DIAGNOSIS — N760 Acute vaginitis: Principal | ICD-10-CM

## 2014-05-18 DIAGNOSIS — B9689 Other specified bacterial agents as the cause of diseases classified elsewhere: Secondary | ICD-10-CM

## 2014-05-18 LAB — THYROID PANEL WITH TSH
FREE THYROXINE INDEX: 2.2 (ref 1.4–3.8)
T3 UPTAKE: 29 % (ref 22–35)
T4, Total: 7.6 ug/dL (ref 4.5–12.0)
TSH: 2.85 u[IU]/mL (ref 0.350–4.500)

## 2014-05-18 LAB — URINE CULTURE
COLONY COUNT: NO GROWTH
ORGANISM ID, BACTERIA: NO GROWTH

## 2014-05-18 LAB — VITAMIN D 25 HYDROXY (VIT D DEFICIENCY, FRACTURES): Vit D, 25-Hydroxy: 18 ng/mL — ABNORMAL LOW (ref 30–100)

## 2014-05-18 LAB — WET PREP BY MOLECULAR PROBE
Candida species: NEGATIVE
GARDNERELLA VAGINALIS: POSITIVE — AB
Trichomonas vaginosis: NEGATIVE

## 2014-05-18 MED ORDER — VITAMIN D (ERGOCALCIFEROL) 1.25 MG (50000 UNIT) PO CAPS: 50000.0000 [IU] | ORAL_CAPSULE | ORAL | Status: DC

## 2014-05-18 MED ORDER — METRONIDAZOLE 0.75 % VA GEL: 1.0000 | Freq: Two times a day (BID) | VAGINAL | Status: DC

## 2014-05-18 NOTE — Progress Notes (Signed)
[name], will you let me see Pap results?  Thanks.  Reviewed personally.  [name], MD.

## 2014-05-21 LAB — IPS PAP TEST WITH HPV

## 2014-05-22 ENCOUNTER — Telehealth: Payer: Self-pay | Admitting: Emergency Medicine

## 2014-05-22 DIAGNOSIS — R87810 Cervical high risk human papillomavirus (HPV) DNA test positive: Secondary | ICD-10-CM

## 2014-05-22 NOTE — Telephone Encounter (Signed)
Spoke with patient and message from Sara Shannon, [place] given to patient. Patient is scheduled for Mirena IUD removal 06/06/14 with Dr. Quincy Simmonds.  Patient states she does not have a cycle with IUD.   Scheduled colposcopy for 05/30/14 with Dr. Quincy Simmonds.   Brief description of procedure given to patient.  Colposcopy pre-procedure instructions given. Discussed menses and need to not have any bleeding on day of appointment, advised to call to reschedule if starts cycle.  Make sure to eat a meal and hydrate before appointment.  Advised 800 mg of Motrin with food one hour prior to appointment. Motrin/Advil or Ibuprofen. Take 800 mg (Can purchase over the counter, you will need four 200 mg pills).  Advised will need to cancel or reschedule within 72 hours or will have $150.00 no show fee placed to account.   Patient verbalized understanding of preprocedure instructions and cancellation policy and will call to reschedule if will be on menses or has any concerns regarding pregnancy.  Sara Shannon, I have scheduled procedures. Can you call with precert?.   Routing to Dr. Quincy Simmonds.   Routing to provider for final review. Patient agreeable to disposition. Will close encounter

## 2014-05-22 NOTE — Addendum Note (Signed)
Addended by: Antonietta Barcelona on: 05/22/2014 08:24 AM   Modules accepted: Orders

## 2014-05-22 NOTE — Telephone Encounter (Signed)
-----   Message from [place], [place] sent at 05/22/2014  1:33 PM EST ----- per Dr. Sabra Heck will cancel HR HPV # 16 & 18 and go right to getting a  Colpo biopsy given her history.

## 2014-05-22 NOTE — Telephone Encounter (Signed)
Message left to return call to [place] at [phone].   Patient has mirena, expires 06-15-2014.   HR HPV on PAP-Needs colposcopy per Dr. Sabra Heck.   Colposcopy order pended for patient notification.

## 2014-05-30 ENCOUNTER — Encounter: Payer: Self-pay | Admitting: Obstetrics and Gynecology

## 2014-05-30 ENCOUNTER — Ambulatory Visit (INDEPENDENT_AMBULATORY_CARE_PROVIDER_SITE_OTHER): Payer: 59 | Admitting: Obstetrics and Gynecology

## 2014-05-30 DIAGNOSIS — R87810 Cervical high risk human papillomavirus (HPV) DNA test positive: Secondary | ICD-10-CM

## 2014-05-30 NOTE — Progress Notes (Signed)
Subjective:     Patient ID: Sara Shannon, female   DOB: [birth year], 34 y.o.   MRN: [id number]  HPI  Pap 05/17/14 - showing inflammation and no dysplasia.  Positive HR HPV.   Had colposcopy in March 2011.  Told she had "precancerous cells." No treatment recommended.   Smoking 1/2 pack per day.   LMP - Doe not remember.  Has Mirena IUD.  Almost due for removal.   Thinking about future child bearing.  Newly married.   Just used Metrogel.  Review of Systems     Objective:   Physical Exam  Genitourinary:        Procedure - Colposcopy. Consent for procedure.  Speculum placed in the vagina.  3% acetic acid placed.  Colposcopy satisfactory.  IUD strings noted.  ECC, biopsy of exocervix at 9, 7, and 3:00 taken and all sent to pathology separately.  Monsel's placed.  Minimal EBL.  No complications.   Gauze soaked with 3% acetic acid to the vulva.  No lesions noted.     Assessment:     Normal pap and positive HR HPV.     Plan:     Discussion of HPV and abnormal paps, and treatment with LEEP.  Discussed smoking cessation and healthy lifestyle with folic acid supplementation for improvement in cervical health.  Follow up biopsies.   An additional 15 minutes spent face to face time of which over 50% was spent in counseling regarding HPV, abnormal paps and LEEP. After visit summary to patient.

## 2014-05-30 NOTE — Addendum Note (Signed)
Addended by: Graylon Good on: 05/30/2014 10:18 AM   Modules accepted: Orders, SmartSet

## 2014-06-01 LAB — IPS OTHER TISSUE BIOPSY

## 2014-06-04 ENCOUNTER — Telehealth: Payer: Self-pay

## 2014-06-04 NOTE — Telephone Encounter (Signed)
Spoke with patient. Advised patient of results as seen below from [place]. Patient is agreeable and verbalizes understanding. Patient would like to move IUD removal up. Appointment moved to March 3rd at 11:30am with Dr.Silva. Patient is agreeable to date and time. 08 recall entered.  Notes Recorded by Jamey Reas de Berton Lan, MD on 06/02/2014 at 5:41 PM Please place in recall - 08 for pap in one year.  Cc- Marisa Sprinkles

## 2014-06-04 NOTE — Telephone Encounter (Signed)
-----   Message from King [place], MD sent at 06/02/2014  5:40 PM EST ----- Please report colpo biopsy results to patient: ECC benign. Of the 3 biopsies of the surface of the cervix, one suggested HPV changes.  No precancer or cancer were seen.  OK to proceed with IUD removal.

## 2014-06-06 ENCOUNTER — Ambulatory Visit: Payer: 59 | Admitting: Obstetrics and Gynecology

## 2014-06-07 ENCOUNTER — Ambulatory Visit (INDEPENDENT_AMBULATORY_CARE_PROVIDER_SITE_OTHER): Payer: 59 | Admitting: Obstetrics and Gynecology

## 2014-06-07 ENCOUNTER — Encounter: Payer: Self-pay | Admitting: Obstetrics and Gynecology

## 2014-06-07 VITALS — BP 120/58 | HR 76 | Ht 64.75 in | Wt 164.4 lb

## 2014-06-07 DIAGNOSIS — Z30432 Encounter for removal of intrauterine contraceptive device: Secondary | ICD-10-CM

## 2014-06-07 DIAGNOSIS — Z975 Presence of (intrauterine) contraceptive device: Secondary | ICD-10-CM | POA: Diagnosis not present

## 2014-06-07 NOTE — Progress Notes (Signed)
Patient ID: Sara Shannon, female   DOB: 28-Jun-1980, 34 y.o.   MRN: [id number] GYNECOLOGY  VISIT   HPI: 34 y.o.   Married  Caucasian  female   G1P1001 with Patient's last menstrual period was 06/14/2009 (exact date).   here for Mirena IUD removal.   Had colposcopy with biopsy on 05/30/14 for positive HR HPV status - satisfactory colposcopy. ECC benign. Of the 3 biopsies of the surface of the cervix, one suggested HPV changes. No precancer or cancer were seen.   Smoking.  Trying to quit. Would like to try for pregnancy.  Current partner is a new partner, not the father of her child. Really excited to plan for the future!  GYNECOLOGIC HISTORY: Patient's last menstrual period was 06/14/2009 (exact date). Contraception: Mirena IUD   Menopausal hormone therapy: n/a        OB History    Gravida Para Term Preterm AB TAB SAB Ectopic Multiple Living   1 1 1       1          There are no active problems to display for this patient.   Past Medical History  Diagnosis Date  . Abnormal Pap smear of cervix 06/2009    pos HR HPV with colpo--"precancerous cells" found per pt. 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 Thyroid disease     middle school, returned to normal after childbirth    Past Surgical History  Procedure Laterality Date  . Tonsillectomy and adenoidectomy  1992  . Cesarean section  10/20/02  . Intrauterine device insertion  06/14/09    Mirena  . Colposcopy  06/2009    "precancerous cells" on biopsy    Current Outpatient Prescriptions  Medication Sig Dispense Refill  . levonorgestrel (MIRENA) 20 MCG/24HR IUD 1 each by Intrauterine route once.    . naproxen (NAPROSYN) 500 MG tablet Take 500 mg by mouth daily as needed for mild pain.    . Vitamin D, Ergocalciferol, (DRISDOL) 50000 UNITS CAPS capsule Take 1 capsule (50,000 Units total) by mouth every 7 (seven) days. 30 capsule 1   No current facility-administered medications for this visit.      ALLERGIES: Review of patient's allergies indicates no known allergies.  Family History  Problem Relation Age of Onset  . Stroke Father 39    blocked carotid artery, left side  . Cancer Maternal Grandmother     lung, smoker  . Emphysema Maternal Grandmother     smoker  . Stroke Maternal Grandfather 4  . Dementia Paternal Grandmother   . Cancer Paternal Grandfather     lung, smoker?    History   Social History  . Marital Status: Married    Spouse Name: N/A  . Number of Children: 1  . Years of Education: N/A   Occupational History  . Not on file.   Social History Main Topics  . Smoking status: Current Every Day Smoker -- 0.50 packs/day    Types: Cigarettes  . Smokeless tobacco: Never Used  . Alcohol Use: No  . Drug Use: No  . Sexual Activity: Yes    Birth Control/ Protection: IUD     Comment: Mirena--inserted 06-14-09   Other Topics Concern  . Not on file   Social History Narrative    ROS:  Pertinent items are noted in HPI.  PHYSICAL EXAMINATION:    BP 120/58 mmHg  Pulse 76  Ht 5' 4.75" (1.645 m)  Wt 164 lb 6.4  oz (74.571 kg)  BMI 27.56 kg/m2  LMP 06/14/2009 (Exact Date)     General appearance: alert, cooperative and appears stated age  Pelvic: External genitalia:  no lesions              Urethra:  normal appearing urethra with no masses, tenderness or lesions              Bartholins and Skenes: normal                 Vagina: normal appearing vagina with normal color and discharge, no lesions              Cervix: normal appearance.  Strings noted.  Ring forceps used to remove the IUD without difficulty.  IUD intact and discarded.                   Bimanual Exam:  Uterus:  uterus is normal size, shape, consistency and nontender                                      Adnexa: normal adnexa in size, nontender and no masses                                       ASSESSMENT  Positive HR HPV.  Colposcopy showing HPV changes but no dysplasia, atypia, or  cancer.  IUD removed.  Desire for fertility.  Smoker.   PLAN  Did some very brief counseling about PNV, smoking cessation, and avoidance of unnecessary medications/exposures.  I suggested that the patient return for prenatal counseling and bring her partner with her.  She will see [place] [facility]. Use condoms right now while menses normalize after removal of the IUD.  Recall for pap for one year - 08.   An After Visit Summary was printed and given to the patient.  __15____ minutes face to face time of which over 50% was spent in counseling.

## 2014-06-20 ENCOUNTER — Ambulatory Visit: Payer: 59 | Admitting: Obstetrics and Gynecology

## 2014-07-05 ENCOUNTER — Ambulatory Visit (INDEPENDENT_AMBULATORY_CARE_PROVIDER_SITE_OTHER): Payer: 59 | Admitting: Nurse Practitioner

## 2014-07-05 ENCOUNTER — Encounter: Payer: Self-pay | Admitting: Nurse Practitioner

## 2014-07-05 VITALS — BP 108/70 | HR 64 | Ht 64.75 in | Wt 164.0 lb

## 2014-07-05 DIAGNOSIS — Z3169 Encounter for other general counseling and advice on procreation: Secondary | ICD-10-CM | POA: Diagnosis not present
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
## 2014-07-06 ENCOUNTER — Encounter: Payer: Self-pay | Admitting: Nurse Practitioner

## 2014-07-06 ENCOUNTER — Ambulatory Visit: Payer: 59 | Admitting: Nurse Practitioner

## 2014-07-09 NOTE — Progress Notes (Signed)
Encounter reviewed by Dr. Ehan Freas Silva.  

## 2014-08-14 ENCOUNTER — Telehealth: Payer: Self-pay | Admitting: Nurse Practitioner

## 2014-08-14 DIAGNOSIS — N912 Amenorrhea, unspecified: Secondary | ICD-10-CM

## 2014-08-14 NOTE — Telephone Encounter (Signed)
Spoke with patient. Advised patient per review of Up to Date Amoxicillin is a class B drug and there are no current risk association with pregnancy. Patient states she will be on Amoxicillin until next Thursday due to an infected tooth. Patient will need tooth removed but dentist wants to make sure she is not pregnant before removal as she has been trying. Patients LMP was 07/26/2014. "I feel a little bit of cramping in my abdomen and my nipples have been sore. I am not sure if it is all in my head or if I could really be pregnant." Patient has an appointment for Vitamin D recheck on 5/12 would like to know if she can have hcg level checked at this time as well. Advised will speak with provider regarding further recommendations and return call. Patient is agreeable.

## 2014-08-14 NOTE — Telephone Encounter (Signed)
Attempted to reach patient at [phone]. There was no answer and recording states that the voicemail box is currently full and not accepting message as this time. Will try again later.

## 2014-08-14 NOTE — Telephone Encounter (Signed)
I think that would be fine if she has not started period by then

## 2014-08-14 NOTE — Telephone Encounter (Signed)
Spoke with patient. Advised of message as seen below from [place]. Patient is agreeable and verbalizes understanding. Patient will monitor bleeding until appointment. I have placed an order for hcg quant to be drawn. Patient will notify lab if she has started her cycle before that appointment as she will not need the hcg level at that time. Note placed on appointment to lab as well.  Routing to provider for final review. Patient agreeable to disposition. Patient aware provider will review message and nurse will return call with any additional instructions or change of disposition. Will close encounter.

## 2014-08-14 NOTE — Telephone Encounter (Signed)
Pt taking amoxicillan for an infected tooth and wondering if that is ok since she is trying to get pregnant. Would it be too soon to take a blood test on Thursday when she come in for her vit D bloodwork on Thursday?

## 2014-08-16 ENCOUNTER — Other Ambulatory Visit (INDEPENDENT_AMBULATORY_CARE_PROVIDER_SITE_OTHER): Payer: 59

## 2014-08-16 DIAGNOSIS — N912 Amenorrhea, unspecified: Secondary | ICD-10-CM

## 2014-08-16 DIAGNOSIS — E559 Vitamin D deficiency, unspecified: Secondary | ICD-10-CM

## 2014-08-17 ENCOUNTER — Telehealth: Payer: Self-pay | Admitting: Nurse Practitioner

## 2014-08-17 LAB — VITAMIN D 25 HYDROXY (VIT D DEFICIENCY, FRACTURES): VIT D 25 HYDROXY: 25 ng/mL — AB (ref 30–100)

## 2014-08-17 LAB — HCG, QUANTITATIVE, PREGNANCY: hCG, Beta Chain, Quant, S: <2 m[IU]/mL

## 2014-08-17 NOTE — Telephone Encounter (Signed)
Spoke with patient. Advised of message as seen below from [place]. Patient is agreeable. Advised Vitamin D level is 25. Per protocol will need to continue on Vitamin D 50,000 IU every 7 days for 6-8 weeks. Will need recheck appointment. Patient is agreeable. Recheck appointment scheduled for 6/27 at 9am. Patient is agreeable to date and time.  Routing to provider for final review. Patient agreeable to disposition.

## 2014-08-17 NOTE — Telephone Encounter (Signed)
Regina Eck CNM please review and advise results from 08/16/2014.

## 2014-08-17 NOTE — Telephone Encounter (Signed)
Notify patient HCG negative

## 2014-08-17 NOTE — Telephone Encounter (Signed)
Pt requesting results. Pt requests results can be left on her voicemail.

## 2014-08-17 NOTE — Telephone Encounter (Signed)
    Patient calling for lab results 

## 2014-08-23 NOTE — Telephone Encounter (Signed)
-----   Message from [place], [place] sent at 08/21/2014  4:22 PM EDT ----- Please have pt. To recheck Vit D as scheduled  6-8 weeks.

## 2014-08-23 NOTE — Telephone Encounter (Signed)
I have attempted to contact this patient by phone with the following results: left message to return call to [place] at [phone] on answering machine (mobile per [facility]).  Advised call was regarding Vit D recheck.

## 2014-08-29 ENCOUNTER — Telehealth: Payer: Self-pay | Admitting: Nurse Practitioner

## 2014-08-29 ENCOUNTER — Inpatient Hospital Stay (HOSPITAL_COMMUNITY)
Admission: AD | Admit: 2014-08-29 | Discharge: 2014-08-29 | Disposition: A | Discharge status: Home / Self Care | Payer: 59 | Source: Ambulatory Visit | Attending: Obstetrics and Gynecology | Admitting: Obstetrics and Gynecology

## 2014-08-29 ENCOUNTER — Inpatient Hospital Stay (HOSPITAL_COMMUNITY): Payer: 59

## 2014-08-29 ENCOUNTER — Encounter (HOSPITAL_COMMUNITY): Payer: Self-pay | Admitting: *Deleted

## 2014-08-29 DIAGNOSIS — F17210 Nicotine dependence, cigarettes, uncomplicated: Secondary | ICD-10-CM | POA: Diagnosis not present

## 2014-08-29 DIAGNOSIS — O200 Threatened abortion: Secondary | ICD-10-CM

## 2014-08-29 DIAGNOSIS — Z3A01 Less than 8 weeks gestation of pregnancy: Secondary | ICD-10-CM | POA: Diagnosis not present

## 2014-08-29 DIAGNOSIS — O3680X0 Pregnancy with inconclusive fetal viability, not applicable or unspecified: Secondary | ICD-10-CM

## 2014-08-29 DIAGNOSIS — O209 Hemorrhage in early pregnancy, unspecified: Secondary | ICD-10-CM

## 2014-08-29 DIAGNOSIS — O99331 Smoking (tobacco) complicating pregnancy, first trimester: Secondary | ICD-10-CM | POA: Diagnosis not present

## 2014-08-29 HISTORY — DX: Hypothyroidism, unspecified: E03.9

## 2014-08-29 LAB — HCG, QUANTITATIVE, PREGNANCY: HCG, BETA CHAIN, QUANT, S: 630 m[IU]/mL — AB (ref <5)

## 2014-08-29 LAB — URINE MICROSCOPIC-ADD ON

## 2014-08-29 LAB — CBC
HEMATOCRIT: 37.4 % (ref 36.0–46.0)
Hemoglobin: 13.3 g/dL (ref 12.0–15.0)
MCH: 33.2 pg (ref 26.0–34.0)
MCHC: 35.6 g/dL (ref 30.0–36.0)
MCV: 93.3 fL (ref 78.0–100.0)
PLATELETS: 250 10*3/uL (ref 150–400)
RBC: 4.01 MIL/uL (ref 3.87–5.11)
RDW: 13.2 % (ref 11.5–15.5)
WBC: 6.5 10*3/uL (ref 4.0–10.5)

## 2014-08-29 LAB — ABO/RH: ABO/RH(D): A POS

## 2014-08-29 LAB — POCT PREGNANCY, URINE: Preg Test, Ur: POSITIVE — AB

## 2014-08-29 LAB — URINALYSIS, ROUTINE W REFLEX MICROSCOPIC
Bilirubin Urine: NEGATIVE
GLUCOSE, UA: NEGATIVE mg/dL
Ketones, ur: NEGATIVE mg/dL
LEUKOCYTES UA: NEGATIVE
Nitrite: NEGATIVE
Protein, ur: NEGATIVE mg/dL
Specific Gravity, Urine: 1.02 (ref 1.005–1.030)
Urobilinogen, UA: 1 mg/dL (ref 0.0–1.0)
pH: 7.5 (ref 5.0–8.0)

## 2014-08-29 NOTE — Telephone Encounter (Signed)
Patient had three positive pregnancy test and been bleeding. Patient would like an appointment today. Last seen 08/16/14.

## 2014-08-29 NOTE — Telephone Encounter (Signed)
Thank you for referring the patient to the [facility] as we discussed.  She can have a stat quant beta HCG and any necessary ultrasound.

## 2014-08-29 NOTE — MAU Note (Signed)
Pt presents complaining of vaginal bleeding. +HPT this weekend. States she has intermittent cramping. States she is having bleeding similar to a period. LMP 07/26/2014. Last intercourse 5/23

## 2014-08-29 NOTE — Telephone Encounter (Deleted)
Spoke with patient. Advised I have spoken with Dr.Silva who recommends patient be seen at MAU today for further evaluation. Patient is agreeable and will head to [facility] hospital at this time.

## 2014-08-29 NOTE — Telephone Encounter (Signed)
Spoke with patient. Advised I have spoken with Dr.Silva who recommends patient be seen at MAU today for further evaluation. Patient is agreeable and will head to [facility] hospital at this time.

## 2014-08-29 NOTE — Telephone Encounter (Signed)
Spoke with patient. Patient was seen on 08/16/2014 with Milford Cage, FNP for preconception counseling. Had serum hcg that day that was negative. Patient started to have light spotting on 5/23 which went away on 5/24. Patient took three UPTs on 5/24 which were all positive. This morning patient woke up and is having increased spotting when using the restroom. Mainly sees with wiping. Is having midline cramping that is "like when I have my cycle". Denies any sharp pain. Denies any urinary symptoms. Advised will need to be seen for further evaluation. Advised I will speak with Dr.Silva regarding symptoms and return call with further recommendations. Patient is agreeable. Aware we do not have ultrasound in the office today and may need to be seen at MAU for evaluation.

## 2014-08-29 NOTE — MAU Provider Note (Signed)
History     CSN: [id number]  Arrival date and time: 08/29/14 1033   None     Chief Complaint  Patient presents with  . Vaginal Bleeding  . Possible Pregnancy   HPI   [name] is a 34 y.o. female G2P1001 at [redacted]w[redacted]d who presents to MAU with vaginal bleeding.  On May 12 her HCG test was negative in the office.  Two days ago she had a home positive pregnancy test. This morning she woke up with vaginal bleeding that was more than spotting; similar to a menstrual cycle.  She called her Dr. Gabriel Carina and they recommended she come in. She has not had to wear a pad; notices it when she wipes after using the bathroom. This morning the blood was "fresh" and bright red.   OB History    Gravida Para Term Preterm AB TAB SAB Ectopic Multiple Living   2 1 1       1       Past Medical History  Diagnosis Date  . Abnormal Pap smear of cervix 06/2009    pos HR HPV with colpo--"precancerous cells" found per pt. 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 Thyroid disease     middle school, returned to normal after childbirth  . Hypothyroidism     Past Surgical History  Procedure Laterality Date  . Tonsillectomy and adenoidectomy  1992  . Cesarean section  10/20/02  . Intrauterine device insertion  06/14/09    Mirena  . Colposcopy  06/2009    "precancerous cells" on biopsy    Family History  Problem Relation Age of Onset  . Stroke Father 47    blocked carotid artery, left side  . Cancer Maternal Grandmother     lung, smoker  . Emphysema Maternal Grandmother     smoker  . Stroke Maternal Grandfather 35  . Dementia Paternal Grandmother   . Cancer Paternal Grandfather     lung, smoker?    History  Substance Use Topics  . Smoking status: Current Every Day Smoker -- 0.50 packs/day    Types: Cigarettes  . Smokeless tobacco: Never Used  . Alcohol Use: No    Allergies: No Known Allergies  Prescriptions prior to admission  Medication Sig Dispense Refill Last  Dose  . acetaminophen-codeine (TYLENOL #3) 300-30 MG per tablet Take 1 tablet by mouth every 4 (four) hours as needed for moderate pain (tooth pain).   Past Week at Unknown time  . amoxicillin (AMOXIL) 500 MG capsule Take 500 mg by mouth every 8 (eight) hours. 10 day course filled on 08-14-14 for tooth infection.   Patient has couple left to take.   08/28/2014 at Unknown time  . Prenatal Vit-Fe Fumarate-FA (PRENATAL MULTIVITAMIN) TABS tablet Take 1 tablet by mouth daily at 12 noon.   08/28/2014 at Unknown time  . Vitamin D, Ergocalciferol, (DRISDOL) 50000 UNITS CAPS capsule Take 1 capsule (50,000 Units total) by mouth every 7 (seven) days. (Patient taking differently: Take 50,000 Units by mouth every 7 (seven) days. On Wednesdays) 30 capsule 1 Past Week at Unknown time   Results for orders placed or performed during the hospital encounter of 08/29/14 (from the past 48 hour(s))  Urinalysis, Routine w reflex microscopic     Status: Abnormal   Collection Time: 08/29/14 10:49 AM  Result Value Ref Range   Color, Urine YELLOW YELLOW   APPearance CLEAR CLEAR   Specific Gravity, Urine 1.020 1.005 - 1.030  pH 7.5 5.0 - 8.0   Glucose, UA NEGATIVE NEGATIVE mg/dL   Hgb urine dipstick LARGE (A) NEGATIVE   Bilirubin Urine NEGATIVE NEGATIVE   Ketones, ur NEGATIVE NEGATIVE mg/dL   Protein, ur NEGATIVE NEGATIVE mg/dL   Urobilinogen, UA 1.0 0.0 - 1.0 mg/dL   Nitrite NEGATIVE NEGATIVE   Leukocytes, UA NEGATIVE NEGATIVE  Urine microscopic-add on     Status: Abnormal   Collection Time: 08/29/14 10:49 AM  Result Value Ref Range   Squamous Epithelial / LPF FEW (A) RARE   RBC / HPF 0-2 <3 RBC/hpf   Bacteria, UA RARE RARE  Pregnancy, urine POC     Status: Abnormal   Collection Time: 08/29/14 11:01 AM  Result Value Ref Range   Preg Test, Ur POSITIVE (A) NEGATIVE    Comment:        THE SENSITIVITY OF THIS METHODOLOGY IS >24 mIU/mL   hCG, quantitative, pregnancy     Status: Abnormal   Collection Time:  08/29/14  1:12 PM  Result Value Ref Range   hCG, Beta Chain, Quant, S 630 (H) <5 mIU/mL    Comment:          GEST. AGE      CONC.  (mIU/mL)   <=1 WEEK        5 - 50     2 WEEKS       50 - 500     3 WEEKS       100 - 10,000     4 WEEKS     1,000 - 30,000     5 WEEKS     3,500 - 115,000   6-8 WEEKS     12,000 - 270,000    12 WEEKS     15,000 - 220,000        FEMALE AND NON-PREGNANT FEMALE:     LESS THAN 5 mIU/mL   CBC     Status: None   Collection Time: 08/29/14  1:12 PM  Result Value Ref Range   WBC 6.5 4.0 - 10.5 K/uL   RBC 4.01 3.87 - 5.11 MIL/uL   Hemoglobin 13.3 12.0 - 15.0 g/dL   HCT 37.4 36.0 - 46.0 %   MCV 93.3 78.0 - 100.0 fL   MCH 33.2 26.0 - 34.0 pg   MCHC 35.6 30.0 - 36.0 g/dL   RDW 13.2 11.5 - 15.5 %   Platelets 250 150 - 400 K/uL  ABO/Rh     Status: None   Collection Time: 08/29/14  1:12 PM  Result Value Ref Range   ABO/RH(D) A POS     Review of Systems  Constitutional: Negative for fever and chills.  Gastrointestinal: Negative for nausea, vomiting and abdominal pain.  Genitourinary: Negative for dysuria.   Physical Exam   Blood pressure 121/78, pulse 85, temperature 98 F (36.7 C), temperature source Oral, resp. rate 20, height 5\' 4"  (1.626 m), weight 70.943 kg (156 lb 6.4 oz), last menstrual period 07/26/2014.  Physical Exam  Constitutional: She is oriented to person, place, and time.  Genitourinary:  Speculum exam: Vagina - Small amount of dark red blood in the vaginal canal.  Cervix - + active bleeding  Bimanual exam: Cervix closed Uterus non tender, normal size Adnexa non tender, no masses bilaterally Chaperone present for exam.  Musculoskeletal: Normal range of motion.  Neurological: She is alert and oriented to person, place, and time.  Skin: Skin is warm.  Psychiatric: Her behavior is normal.    MAU  Course  Procedures  MDM A positive blood type   Discussed Labs and [place] with Dr. Sabra Heck.  The patient will return to MAU if 48 hours for  a repeat beta hcg level. She will follow up in the office next week.     Assessment and Plan   A:  1. Threatened miscarriage   2. Vaginal bleeding in pregnancy, first trimester   3. Pregnancy of unknown anatomic location     P:  Discharge home in stable condition  Return to MAU in 48 hours for repeat beta hcg level Bleeding precautions Ectopic precautions Pelvic rest encouraged Return to MAU sooner if symptoms worsen   Lezlie Lye, NP 08/29/2014 4:51 PM

## 2014-08-31 ENCOUNTER — Inpatient Hospital Stay (HOSPITAL_COMMUNITY)
Admission: EM | Admit: 2014-08-31 | Discharge: 2014-08-31 | Disposition: A | Discharge status: Home / Self Care | Payer: 59 | Source: Ambulatory Visit | Attending: Obstetrics & Gynecology | Admitting: Obstetrics & Gynecology

## 2014-08-31 DIAGNOSIS — E039 Hypothyroidism, unspecified: Secondary | ICD-10-CM | POA: Insufficient documentation

## 2014-08-31 DIAGNOSIS — F17210 Nicotine dependence, cigarettes, uncomplicated: Secondary | ICD-10-CM | POA: Diagnosis not present

## 2014-08-31 DIAGNOSIS — O99331 Smoking (tobacco) complicating pregnancy, first trimester: Secondary | ICD-10-CM | POA: Diagnosis not present

## 2014-08-31 DIAGNOSIS — O209 Hemorrhage in early pregnancy, unspecified: Secondary | ICD-10-CM | POA: Diagnosis not present

## 2014-08-31 DIAGNOSIS — O0281 Inappropriate change in quantitative human chorionic gonadotropin (hCG) in early pregnancy: Secondary | ICD-10-CM | POA: Diagnosis not present

## 2014-08-31 DIAGNOSIS — Z3A01 Less than 8 weeks gestation of pregnancy: Secondary | ICD-10-CM | POA: Diagnosis not present

## 2014-08-31 DIAGNOSIS — O99281 Endocrine, nutritional and metabolic diseases complicating pregnancy, first trimester: Secondary | ICD-10-CM | POA: Insufficient documentation

## 2014-08-31 LAB — HCG, QUANTITATIVE, PREGNANCY: HCG, BETA CHAIN, QUANT, S: 387 m[IU]/mL — AB (ref <5)

## 2014-08-31 NOTE — MAU Note (Signed)
Pt here for repeat BHCG, pt had brownish discharge this a.m., none now.  Denies pain.

## 2014-08-31 NOTE — MAU Provider Note (Signed)
History     CSN: [id number]  Arrival date and time: 08/31/14 1355   None     No chief complaint on file.  HPI [place] E Cedotal 34 y.o. G2P1001 @[redacted]w[redacted]d  presents to MAU for f/u quant HCG.  She has had bleeding like a light period all week.  Scant today.  NO abdominal pain.  No fever, weakness, HA, CP, SOB, dysuria.   OB History    Gravida Para Term Preterm AB TAB SAB Ectopic Multiple Living   2 1 1       1       Past Medical History  Diagnosis Date  . Abnormal Pap smear of cervix 06/2009    pos HR HPV with colpo--"precancerous cells" found per pt. 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 Thyroid disease     middle school, returned to normal after childbirth  . Hypothyroidism     Past Surgical History  Procedure Laterality Date  . Tonsillectomy and adenoidectomy  1992  . Cesarean section  10/20/02  . Intrauterine device insertion  06/14/09    Mirena  . Colposcopy  06/2009    "precancerous cells" on biopsy    Family History  Problem Relation Age of Onset  . Stroke Father 55    blocked carotid artery, left side  . Cancer Maternal Grandmother     lung, smoker  . Emphysema Maternal Grandmother     smoker  . Stroke Maternal Grandfather 70  . Dementia Paternal Grandmother   . Cancer Paternal Grandfather     lung, smoker?    History  Substance Use Topics  . Smoking status: Current Every Day Smoker -- 0.50 packs/day    Types: Cigarettes  . Smokeless tobacco: Never Used  . Alcohol Use: No    Allergies: No Known Allergies  Prescriptions prior to admission  Medication Sig Dispense Refill Last Dose  . acetaminophen-codeine (TYLENOL #3) 300-30 MG per tablet Take 1 tablet by mouth every 4 (four) hours as needed for moderate pain (tooth pain).   Past Week at Unknown time  . amoxicillin (AMOXIL) 500 MG capsule Take 500 mg by mouth every 8 (eight) hours. 10 day course filled on 08-14-14 for tooth infection.   Patient has couple left to take.   08/28/2014 at  Unknown time  . Prenatal Vit-Fe Fumarate-FA (PRENATAL MULTIVITAMIN) TABS tablet Take 1 tablet by mouth daily at 12 noon.   08/28/2014 at Unknown time  . Vitamin D, Ergocalciferol, (DRISDOL) 50000 UNITS CAPS capsule Take 1 capsule (50,000 Units total) by mouth every 7 (seven) days. (Patient taking differently: Take 50,000 Units by mouth every 7 (seven) days. On Wednesdays) 30 capsule 1 Past Week at Unknown time    ROS Pertinent ROS in HPI.  All other systems are negative.   Physical Exam   Blood pressure 135/60, pulse 74, temperature 98.5 F (36.9 C), temperature source Oral, resp. rate 16, last menstrual period 07/26/2014.  Physical Exam  Constitutional: She is oriented to person, place, and time. She appears well-developed and well-nourished. No distress.  Respiratory: Effort normal. No respiratory distress.  Neurological: She is alert and oriented to person, place, and time.  Psychiatric: She has a normal mood and affect.   Results for orders placed or performed during the hospital encounter of 08/31/14 (from the past 24 hour(s))  hCG, quantitative, pregnancy     Status: Abnormal   Collection Time: 08/31/14  2:22 PM  Result Value Ref Range   hCG,  Beta Chain, Quant, S 387 (H) <5 mIU/mL    MAU Course  Procedures  MDM Discussed with Dr. Sabra Heck.  Pt's HCG has dropped significantly in the previous 48 hours.  Okay to discharge pt to home with f/u in office next week for lab only on Tuesday at 9am.    Assessment and Plan  A:  1. Inappropriate change in quantitative hCG in early pregnancy    P: Discharge to home F/u in office Tuesday 9am lab only Patient may return to MAU as needed or if her condition were to change or worsen   Paticia Stack 08/31/2014, 4:38 PM

## 2014-09-04 ENCOUNTER — Telehealth: Payer: Self-pay

## 2014-09-04 ENCOUNTER — Telehealth: Payer: Self-pay | Admitting: Obstetrics & Gynecology

## 2014-09-04 ENCOUNTER — Other Ambulatory Visit (INDEPENDENT_AMBULATORY_CARE_PROVIDER_SITE_OTHER): Payer: 59

## 2014-09-04 DIAGNOSIS — O039 Complete or unspecified spontaneous abortion without complication: Secondary | ICD-10-CM

## 2014-09-04 DIAGNOSIS — O0281 Inappropriate change in quantitative human chorionic gonadotropin (hCG) in early pregnancy: Secondary | ICD-10-CM

## 2014-09-04 NOTE — Telephone Encounter (Signed)
Called pt and discussed with her the importance of following HCGs down to normal to ensure that she indeed is having a miscarriage vs an ectopic pregnancy.  Pt had lots of questions and I answered each one of these.  Pt very thankful for phone call.  Will let her know about test results when back.  Ok to close encounter.

## 2014-09-04 NOTE — Telephone Encounter (Signed)
Patient is here today for a lab appointment and is confused about what is going on. She wants to talk with a nurse.

## 2014-09-04 NOTE — Telephone Encounter (Signed)
Spoke with patient. Advised patient result has not yet returned. Advised as soon as result has returned will have it reviewed by Dr.Miller and return call with further recommendations. Patient is agreeable.  Routing to provider for final review. Patient agreeable to disposition. Will close encounter.

## 2014-09-04 NOTE — Telephone Encounter (Signed)
Please see telephone call dated 09/04/2014 which was routed to [place] for review.  Routing to provider for final review. Patient agreeable to disposition. Will close encounter.

## 2014-09-04 NOTE — Telephone Encounter (Signed)
Patient came in to office today for repeat quant hcg level after visit to [facility] on 5/27. Patient states she was advised by Dr.Miller to be seen in office this morning for this lab work. Patient would like to discuss what is going on. Provided patient with HCG quant levels from 5/25 to 5/27. Advised with drop in hcg level repeat was recommended today so that we may see if her level is continuing to drop. Advised based on these results Dr.Miller will make further recommendations about how to proceed. Patient is agreeable and verbalizes understanding. Advised will be notified as soon as results are in.

## 2014-09-04 NOTE — Telephone Encounter (Signed)
Patient is calling for stat lab results done this morning.

## 2014-09-05 LAB — HCG, QUANTITATIVE, PREGNANCY: HCG, BETA CHAIN, QUANT, S: 95 m[IU]/mL

## 2014-09-10 ENCOUNTER — Telehealth: Payer: Self-pay | Admitting: Nurse Practitioner

## 2014-09-10 NOTE — Telephone Encounter (Addendum)
Call to patient. She states she will be having an infected tooth pulled today. She states that the dentist will start her on antibiotics after the procedure. She wanted to ensure this was okay to do. Advised from GYN standpoint okay for removal of tooth, but that to ensure she updates Dentist on her recent pregnancy and subsequent miscarriage.   Patient denies vaginal bleeding except some light spotting, denies any pelvic or abdominal pain. Patient reports she does have pain in mouth due to tooth concern.   Advised Dr. Sabra Heck would review message and return call with any further instructions, patient agreeable.   Last HCG 95 on 09/04/14, scheduled for lab tomorrow morning.

## 2014-09-10 NOTE — Telephone Encounter (Signed)
Patient states she has a dentist appointment today at 4pm and she needs a call back to see if that's ok to do. Patient needs call back ASAP

## 2014-09-10 NOTE — Telephone Encounter (Signed)
Confirmed with Dr. Sabra Heck okay to keep appointment for removal of infected tooth. Will close encounter.

## 2014-09-11 ENCOUNTER — Other Ambulatory Visit (INDEPENDENT_AMBULATORY_CARE_PROVIDER_SITE_OTHER): Payer: 59

## 2014-09-11 DIAGNOSIS — O039 Complete or unspecified spontaneous abortion without complication: Secondary | ICD-10-CM

## 2014-09-12 ENCOUNTER — Telehealth: Payer: Self-pay | Admitting: Nurse Practitioner

## 2014-09-12 LAB — HCG, QUANTITATIVE, PREGNANCY: hCG, Beta Chain, Quant, S: 6.4 m[IU]/mL

## 2014-09-12 NOTE — Telephone Encounter (Signed)
Patient called again about result waiting for call back

## 2014-09-12 NOTE — Telephone Encounter (Signed)
Patient is waiting to waiting for a call form Dr.Silva's nurse. Patient is very worried and would like to talk to a nurse today. Patient is asking "is everything okay". Please call ASAP.

## 2014-09-12 NOTE — Telephone Encounter (Signed)
Spoke with patient. Results given. Patient is agreeable. Lab appointment scheduled for 6/14 at [place]. Patient is agreeable to date and time.  Notes Recorded by Megan Salon, MD on 09/12/2014 at 4:31 PM Please inform pt this is 6.4. She needs to have it repeated again in one week. Should be normal at that point. Please put on lab schedule.  Routing to provider for final review. Patient agreeable to disposition. Will close encounter.

## 2014-09-12 NOTE — Telephone Encounter (Signed)
Routing to Dr.Silva for review. Patient is being followed for threatened miscarriage.HCG quant from 6/7 is now at 6.4. Repeat lab appointment in one week?

## 2014-09-12 NOTE — Telephone Encounter (Signed)
Patient states she is calling to get test results from lab work. Patient ok for call back

## 2014-09-13 ENCOUNTER — Telehealth: Payer: Self-pay | Admitting: Nurse Practitioner

## 2014-09-13 NOTE — Telephone Encounter (Signed)
Spoke with patient. Patient would like to know if she needs to keep appointment for 6/14 for follow up hcg quant. Advised patient of importance of keeping this appointment to ensure hcg level gets below 5. "I have so many bills from going to the hospital and all these lab appointments. I am so overwhelmed and can't find a way to release my stress. I really want to have sex. Can I not have protected sex?" Advised patient will speak with Dr.Miller regarding recommendations of intercourse and return call. Patient is agreeable.

## 2014-09-13 NOTE — Telephone Encounter (Signed)
Please offer her an appt on Tuesday with me when she comes for labs.  Ok if she declines but please offer.

## 2014-09-13 NOTE — Telephone Encounter (Signed)
Spoke with patient. Advised of message as seen below from [place]. Patient is frustrated by recommendations. "I have talked to a lot of people and they all say they have never heard of this before. I work at [employer]. People have even said you may just be trying to get my money. The last time I bled was Monday. So I can't have intercourse for two weeks? Not even with a condom? I have never met Dr.Miller but this has been a terrible experience. This could have been taken care of weeks ago with a D&C." Apologized to patient that this has been a process. Advised everything we are doing is to protect her heath. Advised do not perform D&C's unless there is an indicated reason. Advised following her hcg level is protocol and very important to unsure everything is clear from her pregnancy. Advised these are our protocols and recommendations and we must adhere to them for the best care of our patients. "I understand but this is just ridiculous. I guess I will just see you guys on Tuesday." Advised will return call to patient once lab results are in from Tuesdays appointment. Patient is agreeable.  Routing to provider for final review. Patient agreeable to disposition. Will close encounter.   Patient aware provider will review message and nurse will return call if any additional advice or change of disposition.

## 2014-09-13 NOTE — Telephone Encounter (Signed)
Pt states she spoke to the nurse 09/12/14 but had additional questions concerning her results and restrictions. Pt agreeable to callback from triage.

## 2014-09-13 NOTE — Telephone Encounter (Signed)
Typically recommend intercourse two weeks after last bleeding episode--which was last week.  Exercise is ok.

## 2014-09-13 NOTE — Telephone Encounter (Signed)
Left message to call Vidalia Serpas at [phone]. 

## 2014-09-14 NOTE — Telephone Encounter (Signed)
Spoke with patient. Offered appointment with Dr.Miller on Tuesday 6/14. Patient declines due to copay cost for OV. Lab appointment kept as scheduled for 6/14 at [facility] to provider for final review. Patient agreeable to disposition. Will close encounter.

## 2014-09-18 ENCOUNTER — Other Ambulatory Visit (INDEPENDENT_AMBULATORY_CARE_PROVIDER_SITE_OTHER): Payer: 59

## 2014-09-18 DIAGNOSIS — O039 Complete or unspecified spontaneous abortion without complication: Secondary | ICD-10-CM

## 2014-09-19 ENCOUNTER — Telehealth: Payer: Self-pay | Admitting: Emergency Medicine

## 2014-09-19 LAB — HCG, QUANTITATIVE, PREGNANCY: hCG, Beta Chain, Quant, S: <2 m[IU]/mL

## 2014-09-19 NOTE — Telephone Encounter (Signed)
Patient returned call and message from Dr. Sabra Heck given. Patient verbalized understanding and agreeable to instructions given.  Patient states that her company may contact our office for [facility] paperwork. Advised if FMLA paperwork or contact is made that patient will be contacted as necessary.  Routing to provider for final review. Patient agreeable to disposition. Will close encounter.

## 2014-09-19 NOTE — Telephone Encounter (Signed)
-----   Message from Megan Salon, MD sent at 09/19/2014  8:26 AM EDT ----- Please inform pt this is negative.  Ok to have intercourse now.  Also, ok to retry for pregnancy if desires.  Should be on a PNV at this time.  Does not need any additional vitamin supplements if on PNV.

## 2014-09-19 NOTE — Telephone Encounter (Signed)
Message left to return call to Anuja Manka at [phone].    

## 2014-10-01 ENCOUNTER — Other Ambulatory Visit: Payer: 59

## 2014-10-01 ENCOUNTER — Telehealth: Payer: Self-pay | Admitting: Nurse Practitioner

## 2014-10-01 NOTE — Telephone Encounter (Signed)
Pt. Due for Vit D and will call

## 2014-10-01 NOTE — Telephone Encounter (Signed)
Left message for patient to reschedule lab appointment.

## 2014-10-01 NOTE — Telephone Encounter (Signed)
Will wait on patient to call and reschedule.

## 2014-10-30 ENCOUNTER — Encounter: Payer: Self-pay | Admitting: Obstetrics and Gynecology

## 2015-03-22 ENCOUNTER — Encounter: Payer: Self-pay | Admitting: Obstetrics and Gynecology

## 2015-03-31 ENCOUNTER — Inpatient Hospital Stay (HOSPITAL_COMMUNITY)
Admission: AD | Admit: 2015-03-31 | Discharge: 2015-03-31 | Disposition: A | Discharge status: Home / Self Care | Payer: 59 | Source: Ambulatory Visit | Attending: Obstetrics and Gynecology | Admitting: Obstetrics and Gynecology

## 2015-03-31 ENCOUNTER — Encounter (HOSPITAL_COMMUNITY): Payer: Self-pay | Admitting: *Deleted

## 2015-03-31 DIAGNOSIS — O2621 Pregnancy care for patient with recurrent pregnancy loss, first trimester: Secondary | ICD-10-CM | POA: Insufficient documentation

## 2015-03-31 DIAGNOSIS — Z3A00 Weeks of gestation of pregnancy not specified: Secondary | ICD-10-CM | POA: Insufficient documentation

## 2015-03-31 LAB — HCG, QUANTITATIVE, PREGNANCY: hCG, Beta Chain, Quant, S: 2469 m[IU]/mL — ABNORMAL HIGH (ref <5)

## 2015-03-31 NOTE — MAU Note (Addendum)
Pt has hx of miscarriage, was told to come here for hormone level checks. Pt very anxious.  Per CNM can leave after blood work is drawn, they will call with results. Pt denies pain or bleeding.  Has had some constipation.  otc list discussed.

## 2015-04-01 LAB — PROGESTERONE: PROGESTERONE: 34.6 ng/mL

## 2015-04-16 DIAGNOSIS — O09291 Supervision of pregnancy with other poor reproductive or obstetric history, first trimester: Secondary | ICD-10-CM | POA: Diagnosis not present

## 2015-04-16 DIAGNOSIS — E039 Hypothyroidism, unspecified: Secondary | ICD-10-CM | POA: Diagnosis not present

## 2015-04-16 DIAGNOSIS — Z3A00 Weeks of gestation of pregnancy not specified: Secondary | ICD-10-CM | POA: Diagnosis not present

## 2015-04-16 DIAGNOSIS — Z8759 Personal history of other complications of pregnancy, childbirth and the puerperium: Secondary | ICD-10-CM | POA: Diagnosis not present

## 2015-05-09 DIAGNOSIS — Z3491 Encounter for supervision of normal pregnancy, unspecified, first trimester: Secondary | ICD-10-CM | POA: Diagnosis not present

## 2015-05-16 DIAGNOSIS — R0981 Nasal congestion: Secondary | ICD-10-CM | POA: Diagnosis not present

## 2015-05-16 DIAGNOSIS — Z124 Encounter for screening for malignant neoplasm of cervix: Secondary | ICD-10-CM | POA: Diagnosis not present

## 2015-05-16 DIAGNOSIS — Z3491 Encounter for supervision of normal pregnancy, unspecified, first trimester: Secondary | ICD-10-CM | POA: Diagnosis not present

## 2015-05-16 DIAGNOSIS — F419 Anxiety disorder, unspecified: Secondary | ICD-10-CM | POA: Diagnosis not present

## 2015-05-16 DIAGNOSIS — N898 Other specified noninflammatory disorders of vagina: Secondary | ICD-10-CM | POA: Diagnosis not present

## 2015-05-16 DIAGNOSIS — R87610 Atypical squamous cells of undetermined significance on cytologic smear of cervix (ASC-US): Secondary | ICD-10-CM | POA: Diagnosis not present

## 2015-05-16 DIAGNOSIS — Z3A11 11 weeks gestation of pregnancy: Secondary | ICD-10-CM | POA: Diagnosis not present

## 2015-05-20 LAB — OB RESULTS CONSOLE HEPATITIS B SURFACE ANTIGEN: HEP B S AG: NEGATIVE

## 2015-05-20 LAB — OB RESULTS CONSOLE VARICELLA ZOSTER ANTIBODY, IGG: Varicella: IMMUNE

## 2015-05-20 LAB — OB RESULTS CONSOLE RUBELLA ANTIBODY, IGM: Rubella: NON-IMMUNE/NOT IMMUNE

## 2015-05-27 ENCOUNTER — Ambulatory Visit: Payer: 59 | Admitting: Nurse Practitioner

## 2015-05-27 DIAGNOSIS — Z3491 Encounter for supervision of normal pregnancy, unspecified, first trimester: Secondary | ICD-10-CM | POA: Diagnosis not present

## 2015-05-27 DIAGNOSIS — Z36 Encounter for antenatal screening of mother: Secondary | ICD-10-CM | POA: Diagnosis not present

## 2015-05-27 DIAGNOSIS — Z3A12 12 weeks gestation of pregnancy: Secondary | ICD-10-CM | POA: Diagnosis not present

## 2015-06-12 DIAGNOSIS — F17200 Nicotine dependence, unspecified, uncomplicated: Secondary | ICD-10-CM | POA: Diagnosis not present

## 2015-06-12 DIAGNOSIS — Z3482 Encounter for supervision of other normal pregnancy, second trimester: Secondary | ICD-10-CM | POA: Diagnosis not present

## 2015-06-12 DIAGNOSIS — R87619 Unspecified abnormal cytological findings in specimens from cervix uteri: Secondary | ICD-10-CM | POA: Diagnosis not present

## 2015-06-12 DIAGNOSIS — Z3A14 14 weeks gestation of pregnancy: Secondary | ICD-10-CM | POA: Diagnosis not present

## 2015-06-12 DIAGNOSIS — R87610 Atypical squamous cells of undetermined significance on cytologic smear of cervix (ASC-US): Secondary | ICD-10-CM | POA: Diagnosis not present

## 2015-07-03 DIAGNOSIS — Z36 Encounter for antenatal screening of mother: Secondary | ICD-10-CM | POA: Diagnosis not present

## 2015-07-03 DIAGNOSIS — R87610 Atypical squamous cells of undetermined significance on cytologic smear of cervix (ASC-US): Secondary | ICD-10-CM | POA: Diagnosis not present

## 2015-07-03 DIAGNOSIS — N390 Urinary tract infection, site not specified: Secondary | ICD-10-CM | POA: Diagnosis not present

## 2015-07-03 DIAGNOSIS — Z3A17 17 weeks gestation of pregnancy: Secondary | ICD-10-CM | POA: Diagnosis not present

## 2015-07-03 DIAGNOSIS — R87810 Cervical high risk human papillomavirus (HPV) DNA test positive: Secondary | ICD-10-CM | POA: Diagnosis not present

## 2015-07-03 DIAGNOSIS — Z3482 Encounter for supervision of other normal pregnancy, second trimester: Secondary | ICD-10-CM | POA: Diagnosis not present

## 2015-07-12 ENCOUNTER — Inpatient Hospital Stay (HOSPITAL_COMMUNITY): Payer: 59

## 2015-07-12 ENCOUNTER — Encounter (HOSPITAL_COMMUNITY): Payer: Self-pay | Admitting: *Deleted

## 2015-07-12 ENCOUNTER — Inpatient Hospital Stay (HOSPITAL_COMMUNITY)
Admission: AD | Admit: 2015-07-12 | Discharge: 2015-07-12 | Disposition: A | Discharge status: Home / Self Care | Payer: 59 | Source: Ambulatory Visit | Attending: Obstetrics and Gynecology | Admitting: Obstetrics and Gynecology

## 2015-07-12 DIAGNOSIS — Z3A19 19 weeks gestation of pregnancy: Secondary | ICD-10-CM | POA: Insufficient documentation

## 2015-07-12 DIAGNOSIS — E039 Hypothyroidism, unspecified: Secondary | ICD-10-CM | POA: Diagnosis not present

## 2015-07-12 DIAGNOSIS — J329 Chronic sinusitis, unspecified: Secondary | ICD-10-CM | POA: Insufficient documentation

## 2015-07-12 DIAGNOSIS — R42 Dizziness and giddiness: Secondary | ICD-10-CM | POA: Insufficient documentation

## 2015-07-12 DIAGNOSIS — F17210 Nicotine dependence, cigarettes, uncomplicated: Secondary | ICD-10-CM | POA: Diagnosis not present

## 2015-07-12 DIAGNOSIS — Z79899 Other long term (current) drug therapy: Secondary | ICD-10-CM | POA: Diagnosis not present

## 2015-07-12 DIAGNOSIS — R51 Headache: Secondary | ICD-10-CM | POA: Insufficient documentation

## 2015-07-12 DIAGNOSIS — Z9889 Other specified postprocedural states: Secondary | ICD-10-CM | POA: Insufficient documentation

## 2015-07-12 DIAGNOSIS — O26892 Other specified pregnancy related conditions, second trimester: Secondary | ICD-10-CM | POA: Insufficient documentation

## 2015-07-12 DIAGNOSIS — E079 Disorder of thyroid, unspecified: Secondary | ICD-10-CM | POA: Diagnosis not present

## 2015-07-12 DIAGNOSIS — J321 Chronic frontal sinusitis: Secondary | ICD-10-CM | POA: Diagnosis not present

## 2015-07-12 DIAGNOSIS — R519 Headache, unspecified: Secondary | ICD-10-CM

## 2015-07-12 DIAGNOSIS — R05 Cough: Secondary | ICD-10-CM | POA: Insufficient documentation

## 2015-07-12 LAB — URINE MICROSCOPIC-ADD ON

## 2015-07-12 LAB — URINALYSIS, ROUTINE W REFLEX MICROSCOPIC
BILIRUBIN URINE: NEGATIVE
Glucose, UA: NEGATIVE mg/dL
Hgb urine dipstick: NEGATIVE
Ketones, ur: NEGATIVE mg/dL
Nitrite: NEGATIVE
PH: 6 (ref 5.0–8.0)
Protein, ur: NEGATIVE mg/dL
SPECIFIC GRAVITY, URINE: 1.01 (ref 1.005–1.030)

## 2015-07-12 MED ORDER — BUTALBITAL-APAP-CAFFEINE 50-325-40 MG PO TABS: 1.0000 | ORAL_TABLET | Freq: Four times a day (QID) | ORAL | Status: DC | PRN

## 2015-07-12 MED ORDER — BUTALBITAL-APAP-CAFFEINE 50-325-40 MG PO TABS
2.0000 | ORAL_TABLET | Freq: Once | ORAL | Status: AC
  Administered 2015-07-12: 2 via ORAL
  Filled 2015-07-12: qty 2

## 2015-07-12 MED ORDER — BUTALBITAL-APAP-CAFFEINE 50-325-40 MG PO TABS: 2.0000 | ORAL_TABLET | Freq: Four times a day (QID) | ORAL | Status: DC | PRN

## 2015-07-12 NOTE — Progress Notes (Signed)
Sara Shannon notified that pt is requesting pain medication for her headache.  Provider states she will be down to see the pt shortly.

## 2015-07-12 NOTE — Progress Notes (Signed)
V. Standard, CNM notified that radiology wanted the CT order changed to CT head without contrast.

## 2015-07-12 NOTE — Progress Notes (Signed)
Written and verbal d/c instructions given and understanding voiced. 

## 2015-07-12 NOTE — MAU Provider Note (Signed)
Sara Shannon is a 35 y.o. G3P1001 at 19.1 weeks  Pt states she hit the top of her head on a piece of equipment two weekends ago and ever since she has been having a headache. She states it has been manageable but today whenever she gets up she feels faint and dizzy. Pt states she got 650 mg of Tylenol around 1200 and it has not helped. Pt states she was seen in the office last Monday but she thought the headache may have been related to another sickness but that has since gone away.              History     There are no active problems to display for this patient.   Chief Complaint  Patient presents with  . Headache   HPI  OB History    Gravida Para Term Preterm AB TAB SAB Ectopic Multiple Living   3 1 1       1       Past Medical History  Diagnosis Date  . Abnormal Pap smear of cervix 06/2009    pos HR HPV with colpo--"precancerous cells" found per pt. 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 Thyroid disease     middle school, returned to normal after childbirth  . Hypothyroidism     Past Surgical History  Procedure Laterality Date  . Tonsillectomy and adenoidectomy  1992  . Cesarean section  10/20/02  . Intrauterine device insertion  06/14/09    Mirena  . Colposcopy  06/2009    "precancerous cells" on biopsy    Family History  Problem Relation Age of Onset  . Stroke Father 14    blocked carotid artery, left side  . Cancer Maternal Grandmother     lung, smoker  . Emphysema Maternal Grandmother     smoker  . Stroke Maternal Grandfather 36  . Dementia Paternal Grandmother   . Cancer Paternal Grandfather     lung, smoker?    Social History  Substance Use Topics  . Smoking status: Current Every Day Smoker -- 0.50 packs/day    Types: Cigarettes  . Smokeless tobacco: Never Used  . Alcohol Use: No    Allergies: No Known Allergies  Prescriptions prior to admission  Medication Sig Dispense Refill Last Dose  .  acetaminophen-codeine (TYLENOL #3) 300-30 MG per tablet Take 1 tablet by mouth every 4 (four) hours as needed for moderate pain (tooth pain).   Past Week at Unknown time  . Prenatal Vit-Fe Fumarate-FA (PRENATAL MULTIVITAMIN) TABS tablet Take 1 tablet by mouth daily at 12 noon.   08/28/2014 at Unknown time  . Vitamin D, Ergocalciferol, (DRISDOL) 50000 UNITS CAPS capsule Take 1 capsule (50,000 Units total) by mouth every 7 (seven) days. (Patient taking differently: Take 50,000 Units by mouth every 7 (seven) days. On Wednesdays) 30 capsule 1 Past Week at Unknown time    ROS See HPI above, all other systems are negative  Physical Exam   Blood pressure 114/51, pulse 72, temperature 97.6 F (36.4 C), temperature source Oral, resp. rate 16, last menstrual period 02/28/2015, unknown if currently breastfeeding.  Physical Exam Ext:  WNL ABD: Soft, non tender to palpation, no rebound or guarding SVE:   ED Course  Assessment: IUP at  19.1weeks Membranes: intact FHR: 164 CTX:  none   Plan: Consult with Dr. Barbie Banner CT   Venus Standard, CNM, MSN 07/12/2015. 5:34 PM   Addendum 2025 S:  Patient  reports headache as been ongoing for week since hitting it while working.  Patient endorses that she had a cough and sinus pressure last week as well.  Patient reports that symptoms have improved with onset of headache and dizziness.  O:  Filed Vitals:   07/12/15 1728  BP: 114/51  Pulse: 72  Temp: 97.6 F (36.4 C)  TempSrc: Oral  Resp: 16   Results for orders placed or performed during the hospital encounter of 07/12/15 (from the past 24 hour(s))  Urinalysis, Routine w reflex microscopic (not at [facility])     Status: Abnormal   Collection Time: 07/12/15  5:19 PM  Result Value Ref Range   Color, Urine YELLOW YELLOW   APPearance CLEAR CLEAR   Specific Gravity, Urine 1.010 1.005 - 1.030   pH 6.0 5.0 - 8.0   Glucose, UA NEGATIVE NEGATIVE mg/dL   Hgb urine dipstick NEGATIVE NEGATIVE    Bilirubin Urine NEGATIVE NEGATIVE   Ketones, ur NEGATIVE NEGATIVE mg/dL   Protein, ur NEGATIVE NEGATIVE mg/dL   Nitrite NEGATIVE NEGATIVE   Leukocytes, UA SMALL (A) NEGATIVE  Urine microscopic-add on     Status: Abnormal   Collection Time: 07/12/15  5:19 PM  Result Value Ref Range   Squamous Epithelial / LPF 0-5 (A) NONE SEEN   WBC, UA 0-5 0 - 5 WBC/hpf   RBC / HPF 0-5 0 - 5 RBC/hpf   Bacteria, UA FEW (A) NONE SEEN   CLINICAL DATA: 35 year old presenting with persistent headaches after striking the top or forehead or a piece of equipment approximately 2 weeks ago, acute onset of dizziness when standing which began today. Initial encounter.  EXAM: CT HEAD WITHOUT CONTRAST  TECHNIQUE: Contiguous axial images were obtained from the base of the skull through the vertex without intravenous contrast.  COMPARISON: None.  FINDINGS: Ventricular system normal in size and appearance for age. No mass lesion. No midline shift. No acute hemorrhage or hematoma. No extra-axial fluid collections. No evidence of acute infarction. No focal brain parenchymal abnormality.  No skull fracture or other focal osseous abnormality involving the skull. Mucosal thickening involving the right maxillary sinus. Mucous retention cyst or polyp involving the left maxillary sinus. Mucosal thickening involving the frontal sinuses. Bilateral mastoid air cells well aerated, though the mastoids are underpneumatized. Bilateral middle ear cavities well-aerated.  IMPRESSION: 1. Normal intracranially. 2. Chronic bilateral maxillary and bilateral frontal sinusitis.   A: IUP at 19.1wks Cat I FT Headache Sinusitis, Chronic Asymptomatic  P: Fiorcet now  Will monitor   Follow Up 2135 Patient reports improvement with fiorcet Will send RX for 1-2tablets Q6 hrs prn, Disp 45, RF 0 Encouraged to call if any questions or concerns arise prior to next scheduled office visit.  Keep appt as scheduled:  4/13 Discharged to home in improved condition  Maryann Conners MSN, CNM   07/12/2015 9:37 PM

## 2015-07-12 NOTE — MAU Note (Signed)
Pt states she hit the top of her head on a piece of equipment two weekends ago and ever since she has been having a headache.  She states it has been manageable but today whenever she gets up she feels faint and dizzy.  Pt states she got 650 mg of Tylenol around 1200 and it has not helped.  Pt states she was seen in the office last Monday but she thought the headache may have been related to another sickness but that has since gone away.

## 2015-07-12 NOTE — Discharge Instructions (Signed)
Sinus Headache A sinus headache occurs when the paranasal sinuses become clogged or swollen. Paranasal sinuses are air pockets within the bones of the face. Sinus headaches can range from mild to severe. CAUSES A sinus headache can result from various conditions that affect the sinuses, such as:  Colds.  Sinus infections.  Allergies. SYMPTOMS The main symptom of this condition is a headache that may feel like pain or pressure in the face, forehead, ears, or upper teeth. People who have a sinus headache often have other symptoms, such as:  Congested or runny nose.  Fever.  Inability to smell. Weather changes can make symptoms worse. DIAGNOSIS This condition may be diagnosed based on:  A physical exam and medical history.  Imaging tests, such as a CT scan and MRI, to check for problems with the sinuses.  A specialist may look into the sinuses with a tool that has a camera (endoscopy). TREATMENT Treatment for this condition depends on the cause.  Sinus pain that is caused by a sinus infection may be treated with antibiotic medicine.  Sinus pain that is caused by allergies may be helped by allergy medicines (antihistamines) and medicated nasal sprays.  Sinus pain that is caused by congestion may be helped by flushing the nose and sinuses with saline solution. HOME CARE INSTRUCTIONS  Take medicines only as directed by your health care provider.  If you were prescribed an antibiotic medicine, finish all of it even if you start to feel better.  If you have congestion, use a nasal spray to help reduce pressure.  If directed, apply a warm, moist washcloth to your face to help relieve pain. SEEK MEDICAL CARE IF:  You have headaches more than one time each week.  You have sensitivity to light or sound.  You have a fever.  You feel sick to your stomach (nauseous) or you throw up (vomit).  Your headaches do not get better with treatment. Many people think that they have a  sinus headache when they actually have migraines or tension headaches. SEEK IMMEDIATE MEDICAL CARE IF:  You have vision problems.  You have sudden, severe pain in your face or head.  You have a seizure.  You are confused.  You have a stiff neck.   This information is not intended to replace advice given to you by your health care provider. Make sure you discuss any questions you have with your health care provider.   Document Released: 04/30/2004 Document Revised: 08/07/2014 Document Reviewed: 03/19/2014 Elsevier Interactive Patient Education 2016 [employer]  

## 2015-07-12 NOTE — Progress Notes (Signed)
Gavin Pound, CNM notified that CT results were back.  Provider states when she finishes getting report she will look at the CT results.

## 2015-07-18 DIAGNOSIS — Z3A20 20 weeks gestation of pregnancy: Secondary | ICD-10-CM | POA: Diagnosis not present

## 2015-07-18 DIAGNOSIS — Z36 Encounter for antenatal screening of mother: Secondary | ICD-10-CM | POA: Diagnosis not present

## 2015-07-18 DIAGNOSIS — E039 Hypothyroidism, unspecified: Secondary | ICD-10-CM | POA: Diagnosis not present

## 2015-08-12 DIAGNOSIS — Z9189 Other specified personal risk factors, not elsewhere classified: Secondary | ICD-10-CM | POA: Diagnosis not present

## 2015-08-12 DIAGNOSIS — E039 Hypothyroidism, unspecified: Secondary | ICD-10-CM | POA: Diagnosis not present

## 2015-08-28 ENCOUNTER — Encounter (HOSPITAL_COMMUNITY): Payer: Self-pay | Admitting: *Deleted

## 2015-08-28 ENCOUNTER — Inpatient Hospital Stay (HOSPITAL_COMMUNITY)
Admission: AD | Admit: 2015-08-28 | Discharge: 2015-08-29 | Disposition: A | Discharge status: Home / Self Care | Payer: 59 | Source: Ambulatory Visit | Attending: Obstetrics and Gynecology | Admitting: Obstetrics and Gynecology

## 2015-08-28 DIAGNOSIS — K210 Gastro-esophageal reflux disease with esophagitis: Secondary | ICD-10-CM | POA: Diagnosis not present

## 2015-08-28 DIAGNOSIS — D649 Anemia, unspecified: Secondary | ICD-10-CM | POA: Diagnosis not present

## 2015-08-28 DIAGNOSIS — Z2839 Other underimmunization status: Secondary | ICD-10-CM

## 2015-08-28 DIAGNOSIS — Z283 Underimmunization status: Secondary | ICD-10-CM

## 2015-08-28 DIAGNOSIS — O09299 Supervision of pregnancy with other poor reproductive or obstetric history, unspecified trimester: Secondary | ICD-10-CM

## 2015-08-28 DIAGNOSIS — O99342 Other mental disorders complicating pregnancy, second trimester: Secondary | ICD-10-CM | POA: Diagnosis not present

## 2015-08-28 DIAGNOSIS — E559 Vitamin D deficiency, unspecified: Secondary | ICD-10-CM | POA: Diagnosis not present

## 2015-08-28 DIAGNOSIS — Z3A25 25 weeks gestation of pregnancy: Secondary | ICD-10-CM | POA: Insufficient documentation

## 2015-08-28 DIAGNOSIS — O99512 Diseases of the respiratory system complicating pregnancy, second trimester: Secondary | ICD-10-CM | POA: Insufficient documentation

## 2015-08-28 DIAGNOSIS — K219 Gastro-esophageal reflux disease without esophagitis: Secondary | ICD-10-CM

## 2015-08-28 DIAGNOSIS — O99012 Anemia complicating pregnancy, second trimester: Secondary | ICD-10-CM | POA: Diagnosis not present

## 2015-08-28 DIAGNOSIS — O34219 Maternal care for unspecified type scar from previous cesarean delivery: Secondary | ICD-10-CM | POA: Diagnosis not present

## 2015-08-28 DIAGNOSIS — F419 Anxiety disorder, unspecified: Secondary | ICD-10-CM | POA: Diagnosis present

## 2015-08-28 DIAGNOSIS — K529 Noninfective gastroenteritis and colitis, unspecified: Secondary | ICD-10-CM

## 2015-08-28 DIAGNOSIS — F17210 Nicotine dependence, cigarettes, uncomplicated: Secondary | ICD-10-CM | POA: Insufficient documentation

## 2015-08-28 DIAGNOSIS — Z98891 History of uterine scar from previous surgery: Secondary | ICD-10-CM

## 2015-08-28 DIAGNOSIS — O99282 Endocrine, nutritional and metabolic diseases complicating pregnancy, second trimester: Secondary | ICD-10-CM | POA: Diagnosis not present

## 2015-08-28 DIAGNOSIS — O9989 Other specified diseases and conditions complicating pregnancy, childbirth and the puerperium: Secondary | ICD-10-CM

## 2015-08-28 DIAGNOSIS — R109 Unspecified abdominal pain: Secondary | ICD-10-CM | POA: Diagnosis present

## 2015-08-28 DIAGNOSIS — E039 Hypothyroidism, unspecified: Secondary | ICD-10-CM | POA: Diagnosis not present

## 2015-08-28 DIAGNOSIS — N949 Unspecified condition associated with female genital organs and menstrual cycle: Secondary | ICD-10-CM

## 2015-08-28 LAB — CBC
HCT: 29 % — ABNORMAL LOW (ref 36.0–46.0)
HEMOGLOBIN: 9.9 g/dL — AB (ref 12.0–15.0)
MCH: 32.9 pg (ref 26.0–34.0)
MCHC: 34.1 g/dL (ref 30.0–36.0)
MCV: 96.3 fL (ref 78.0–100.0)
Platelets: 211 10*3/uL (ref 150–400)
RBC: 3.01 MIL/uL — AB (ref 3.87–5.11)
RDW: 13.8 % (ref 11.5–15.5)
WBC: 10.3 10*3/uL (ref 4.0–10.5)

## 2015-08-28 LAB — URINALYSIS, ROUTINE W REFLEX MICROSCOPIC
Bilirubin Urine: NEGATIVE
Glucose, UA: NEGATIVE mg/dL
Hgb urine dipstick: NEGATIVE
Ketones, ur: NEGATIVE mg/dL
Nitrite: NEGATIVE
PROTEIN: NEGATIVE mg/dL
Specific Gravity, Urine: <1.005 — ABNORMAL LOW (ref 1.005–1.030)
pH: 6 (ref 5.0–8.0)

## 2015-08-28 LAB — WET PREP, GENITAL
Clue Cells Wet Prep HPF POC: NONE SEEN
Sperm: NONE SEEN
Trich, Wet Prep: NONE SEEN
Yeast Wet Prep HPF POC: NONE SEEN

## 2015-08-28 LAB — URINE MICROSCOPIC-ADD ON

## 2015-08-28 MED ORDER — LACTATED RINGERS IV BOLUS (SEPSIS)
1000.0000 mL | Freq: Once | INTRAVENOUS | Status: AC
  Administered 2015-08-28: 1000 mL via INTRAVENOUS

## 2015-08-28 MED ORDER — PANTOPRAZOLE SODIUM 40 MG IV SOLR
40.0000 mg | Freq: Once | INTRAVENOUS | Status: AC
  Administered 2015-08-28: 40 mg via INTRAVENOUS
  Filled 2015-08-28: qty 40

## 2015-08-28 MED ORDER — ONDANSETRON HCL 40 MG/20ML IJ SOLN
8.0000 mg | Freq: Once | INTRAMUSCULAR | Status: AC
  Administered 2015-08-28: 8 mg via INTRAVENOUS
  Filled 2015-08-28: qty 4

## 2015-08-28 NOTE — MAU Note (Signed)
Pt presents complaining of soft stools since Sunday but denies watery stool. Also having cramping in her abdomen. Reports good fetal movement. Denies vaginal bleeding or discharge.

## 2015-08-28 NOTE — Discharge Instructions (Signed)
Viral Gastroenteritis Viral gastroenteritis is also known as stomach flu. This condition affects the stomach and intestinal tract. It can cause sudden diarrhea and vomiting. The illness typically lasts 3 to 8 days. Most people develop an immune response that eventually gets rid of the virus. While this natural response develops, the virus can make you quite ill. CAUSES  Many different viruses can cause gastroenteritis, such as rotavirus or noroviruses. You can catch one of these viruses by consuming contaminated food or water. You may also catch a virus by sharing utensils or other personal items with an infected person or by touching a contaminated surface. SYMPTOMS  The most common symptoms are diarrhea and vomiting. These problems can cause a severe loss of body fluids (dehydration) and a body salt (electrolyte) imbalance. Other symptoms may include:  Fever.  Headache.  Fatigue.  Abdominal pain. DIAGNOSIS  Your caregiver can usually diagnose viral gastroenteritis based on your symptoms and a physical exam. A stool sample may also be taken to test for the presence of viruses or other infections. TREATMENT  This illness typically goes away on its own. Treatments are aimed at rehydration. The most serious cases of viral gastroenteritis involve vomiting so severely that you are not able to keep fluids down. In these cases, fluids must be given through an intravenous line (IV). HOME CARE INSTRUCTIONS   Drink enough fluids to keep your urine clear or pale yellow. Drink small amounts of fluids frequently and increase the amounts as tolerated.  Ask your caregiver for specific rehydration instructions.  Avoid:  Foods high in sugar.  Alcohol.  Carbonated drinks.  Tobacco.  Juice.  Caffeine drinks.  Extremely hot or cold fluids.  Fatty, greasy foods.  Too much intake of anything at one time.  Dairy products until 24 to 48 hours after diarrhea stops.  You may consume probiotics.  Probiotics are active cultures of beneficial bacteria. They may lessen the amount and number of diarrheal stools in adults. Probiotics can be found in yogurt with active cultures and in supplements.  Wash your hands well to avoid spreading the virus.  Only take over-the-counter or prescription medicines for pain, discomfort, or fever as directed by your caregiver. Do not give aspirin to children. Antidiarrheal medicines are not recommended.  Ask your caregiver if you should continue to take your regular prescribed and over-the-counter medicines.  Keep all follow-up appointments as directed by your caregiver. SEEK IMMEDIATE MEDICAL CARE IF:   You are unable to keep fluids down.  You do not urinate at least once every 6 to 8 hours.  You develop shortness of breath.  You notice blood in your stool or vomit. This may look like coffee grounds.  You have abdominal pain that increases or is concentrated in one small area (localized).  You have persistent vomiting or diarrhea.  You have a fever.  The patient is a child younger than 3 months, and he or she has a fever.  The patient is a child older than 3 months, and he or she has a fever and persistent symptoms.  The patient is a child older than 3 months, and he or she has a fever and symptoms suddenly get worse.  The patient is a baby, and he or she has no tears when crying. MAKE SURE YOU:   Understand these instructions.  Will watch your condition.  Will get help right away if you are not doing well or get worse.   This information is not intended to replace  advice given to you by your health care provider. Make sure you discuss any questions you have with your health care provider.   Document Released: 03/23/2005 Document Revised: 06/15/2011 Document Reviewed: 01/07/2011 Elsevier Interactive Patient Education 2016 Sara Shannon. Gastroesophageal Reflux Disease, Adult Normally, food travels down the esophagus and stays in the  stomach to be digested. However, when a person has gastroesophageal reflux disease (GERD), food and stomach acid move back up into the esophagus. When this happens, the esophagus becomes sore and inflamed. Over time, GERD can create small holes (ulcers) in the lining of the esophagus.  CAUSES This condition is caused by a problem with the muscle between the esophagus and the stomach (lower esophageal sphincter, or LES). Normally, the LES muscle closes after food passes through the esophagus to the stomach. When the LES is weakened or abnormal, it does not close properly, and that allows food and stomach acid to go back up into the esophagus. The LES can be weakened by certain dietary substances, medicines, and medical conditions, including:  Tobacco use.  Pregnancy.  Having a hiatal hernia.  Heavy alcohol use.  Certain foods and beverages, such as coffee, chocolate, onions, and peppermint. RISK FACTORS This condition is more likely to develop in:  People who have an increased body weight.  People who have connective tissue disorders.  People who use NSAID medicines. SYMPTOMS Symptoms of this condition include:  Heartburn.  Difficult or painful swallowing.  The feeling of having a lump in the throat.  Abitter taste in the mouth.  Bad breath.  Having a large amount of saliva.  Having an upset or bloated stomach.  Belching.  Chest pain.  Shortness of breath or wheezing.  Ongoing (chronic) cough or a night-time cough.  Wearing away of tooth enamel.  Weight loss. Different conditions can cause chest pain. Make sure to see your health care provider if you experience chest pain. DIAGNOSIS Your health care provider will take a medical history and perform a physical exam. To determine if you have mild or severe GERD, your health care provider may also monitor how you respond to treatment. You may also have other tests, including:  An endoscopy toexamine your stomach and  esophagus with a small camera.  A test thatmeasures the acidity level in your esophagus.  A test thatmeasures how much pressure is on your esophagus.  A barium swallow or modified barium swallow to show the shape, size, and functioning of your esophagus. TREATMENT The goal of treatment is to help relieve your symptoms and to prevent complications. Treatment for this condition may vary depending on how severe your symptoms are. Your health care provider may recommend:  Changes to your diet.  Medicine.  Surgery. HOME CARE INSTRUCTIONS Diet  Follow a diet as recommended by your health care provider. This may involve avoiding foods and drinks such as:  Coffee and tea (with or without caffeine).  Drinks that containalcohol.  Energy drinks and sports drinks.  Carbonated drinks or sodas.  Chocolate and cocoa.  Peppermint and mint flavorings.  Garlic and onions.  Horseradish.  Spicy and acidic foods, including peppers, chili powder, curry powder, vinegar, hot sauces, and barbecue sauce.  Citrus fruit juices and citrus fruits, such as oranges, lemons, and limes.  Tomato-based foods, such as red sauce, chili, salsa, and pizza with red sauce.  Fried and fatty foods, such as donuts, french fries, potato chips, and high-fat dressings.  High-fat meats, such as hot dogs and fatty cuts of red and  white meats, such as rib eye steak, sausage, ham, and bacon.  High-fat dairy items, such as whole milk, butter, and cream cheese.  Eat small, frequent meals instead of large meals.  Avoid drinking large amounts of liquid with your meals.  Avoid eating meals during the 2-3 hours before bedtime.  Avoid lying down right after you eat.  Do not exercise right after you eat. General Instructions  Pay attention to any changes in your symptoms.  Take over-the-counter and prescription medicines only as told by your health care provider. Do not take aspirin, ibuprofen, or other NSAIDs  unless your health care provider told you to do so.  Do not use any tobacco products, including cigarettes, chewing tobacco, and e-cigarettes. If you need help quitting, ask your health care provider.  Wear loose-fitting clothing. Do not wear anything tight around your waist that causes pressure on your abdomen.  Raise (elevate) the head of your bed 6 inches (15cm).  Try to reduce your stress, such as with yoga or meditation. If you need help reducing stress, ask your health care provider.  If you are overweight, reduce your weight to an amount that is healthy for you. Ask your health care provider for guidance about a safe weight loss goal.  Keep all follow-up visits as told by your health care provider. This is important. SEEK MEDICAL CARE IF:  You have new symptoms.  You have unexplained weight loss.  You have difficulty swallowing, or it hurts to swallow.  You have wheezing or a persistent cough.  Your symptoms do not improve with treatment.  You have a hoarse voice. SEEK IMMEDIATE MEDICAL CARE IF: Food Choices for Gastroesophageal Reflux Disease, Adult When you have gastroesophageal reflux disease (GERD), the foods you eat and your eating habits are very important. Choosing the right foods can help ease the discomfort of GERD. WHAT GENERAL GUIDELINES DO I NEED TO FOLLOW? Choose fruits, vegetables, whole grains, low-fat dairy products, and low-fat meat, fish, and poultry. Limit fats such as oils, salad dressings, butter, nuts, and avocado. Keep a food diary to identify foods that cause symptoms. Avoid foods that cause reflux. These may be different for different people. Eat frequent small meals instead of three large meals each day. Eat your meals slowly, in a relaxed setting. Limit fried foods. Cook foods using methods other than frying. Avoid drinking alcohol. Avoid drinking large amounts of liquids with your meals. Avoid bending over or lying down until 2-3 hours after  eating. WHAT FOODS ARE NOT RECOMMENDED? The following are some foods and drinks that may worsen your symptoms: Vegetables Tomatoes. Tomato juice. Tomato and spaghetti sauce. Chili peppers. Onion and garlic. Horseradish. Fruits Oranges, grapefruit, and lemon (fruit and juice). Meats High-fat meats, fish, and poultry. This includes hot dogs, ribs, ham, sausage, salami, and bacon. Dairy Whole milk and chocolate milk. Sour cream. Cream. Butter. Ice cream. Cream cheese.  Beverages Coffee and tea, with or without caffeine. Carbonated beverages or energy drinks. Condiments Hot sauce. Barbecue sauce.  Sweets/Desserts Chocolate and cocoa. Donuts. Peppermint and spearmint. Fats and Oils High-fat foods, including [place] fries and potato chips. Other Vinegar. Strong spices, such as black pepper, white pepper, red pepper, cayenne, curry powder, cloves, ginger, and chili powder. The items listed above may not be a complete list of foods and beverages to avoid. Contact your dietitian for more information.   This information is not intended to replace advice given to you by your health care provider. Make sure you discuss any  questions you have with your health care provider.   Document Released: 03/23/2005 Document Revised: 04/13/2014 Document Reviewed: 01/25/2013 Elsevier Interactive Patient Education 2016 Sara Shannon have pain in your arms, neck, jaw, teeth, or back.  You feel sweaty, dizzy, or light-headed.  You have chest pain or shortness of breath.  You vomit and your vomit looks like blood or coffee grounds.  You faint.  Your stool is bloody or black.  You cannot swallow, drink, or eat.   This information is not intended to replace advice given to you by your health care provider. Make sure you discuss any questions you have with your health care provider.   Document Released: 12/31/2004 Document Revised: 12/12/2014 Document Reviewed: 07/18/2014 Elsevier Interactive  Patient Education 2016 Sara Shannon. Round Ligament Pain The round ligament is a cord of muscle and tissue that helps to support the uterus. It can become a source of pain during pregnancy if it becomes stretched or twisted as the baby grows. The pain usually begins in the second trimester of pregnancy, and it can come and go until the baby is delivered. It is not a serious problem, and it does not cause harm to the baby. Round ligament pain is usually a short, sharp, and pinching pain, but it can also be a dull, lingering, and aching pain. The pain is felt in the lower side of the abdomen or in the groin. It usually starts deep in the groin and moves up to the outside of the hip area. Pain can occur with:  A sudden change in position.  Rolling over in bed.  Coughing or sneezing.  Physical activity. HOME CARE INSTRUCTIONS Watch your condition for any changes. Take these steps to help with your pain:  When the pain starts, relax. Then try:  Sitting down.  Flexing your knees up to your abdomen.  Lying on your side with one pillow under your abdomen and another pillow between your legs.  Sitting in a warm bath for 15-20 minutes or until the pain goes away.  Take over-the-counter and prescription medicines only as told by your health care provider.  Move slowly when you sit and stand.  Avoid long walks if they cause pain.  Stop or lessen your physical activities if they cause pain. SEEK MEDICAL CARE IF:  Your pain does not go away with treatment.  You feel pain in your back that you did not have before.  Your medicine is not helping. SEEK IMMEDIATE MEDICAL CARE IF:  You develop a fever or chills.  You develop uterine contractions.  You develop vaginal bleeding.  You develop nausea or vomiting.  You develop diarrhea.  You have pain when you urinate.   This information is not intended to replace advice given to you by your health care provider. Make sure you discuss  any questions you have with your health care provider.   Document Released: 12/31/2007 Document Revised: 06/15/2011 Document Reviewed: 05/30/2014 Elsevier Interactive Patient Education 2016 Sara Shannon. Pregnancy and Anemia Anemia is a condition in which the concentration of red blood cells or hemoglobin in the blood is below normal. Hemoglobin is a substance in red blood cells that carries oxygen to the tissues of the body. Anemia results in not enough oxygen reaching these tissues.  Anemia during pregnancy is common because the fetus uses more iron and folic acid as it is developing. Your body may not produce enough red blood cells because of this. Also, during pregnancy, the liquid part of  the blood (plasma) increases by about 50%, and the red blood cells increase by only 25%. This lowers the concentration of the red blood cells and creates a natural anemia-like situation.  CAUSES  The most common cause of anemia during pregnancy is not having enough iron in the body to make red blood cells (iron deficiency anemia). Other causes may include:  Folic acid deficiency.  Vitamin B12 deficiency.  Certain prescription or over-the-counter medicines.  Certain medical conditions or infections that destroy red blood cells.  A low platelet count and bleeding caused by antibodies that go through the placenta to the fetus from the mother's blood. SIGNS AND SYMPTOMS  Mild anemia may not be noticeable. If it becomes severe, symptoms may include:  Tiredness.  Shortness of breath, especially with exercise.  Weakness.  Fainting.  Pale looking skin.  Headaches.  Feeling a fast or irregular heartbeat (palpitations). DIAGNOSIS  The type of anemia is usually diagnosed from your family and medical history and blood tests. TREATMENT  Treatment of anemia during pregnancy depends on the cause of the anemia. Treatment can include:  Supplements of iron, vitamin 123456, or folic acid.  A blood  transfusion. This may be needed if blood loss is severe.  Hospitalization. This may be needed if there is significant continual blood loss.  Dietary changes. HOME CARE INSTRUCTIONS   Follow your dietitian's or health care provider's dietary recommendations.  Increase your vitamin C intake. This will help the stomach absorb more iron.  Eat a diet rich in iron. This would include foods such as:  Liver.  Beef.  Whole grain bread.  Eggs.  Dried fruit.  Take iron and vitamins as directed by your health care provider.  Eat green leafy vegetables. These are a good source of folic acid. SEEK MEDICAL CARE IF:   You have frequent or lasting headaches.  You are looking pale.  You are bruising easily. SEEK IMMEDIATE MEDICAL CARE IF:   You have extreme weakness, shortness of breath, or chest pain.  You become dizzy or have trouble concentrating.  You have heavy vaginal bleeding.  You develop a rash.  You have bloody or black, tarry stools.  You faint.  You vomit up blood.  You vomit repeatedly.  You have abdominal pain.  You have a fever or persistent symptoms for more than 2-3 days.  You have a fever and your symptoms suddenly get worse.  You are dehydrated. MAKE SURE YOU:   Understand these instructions.  Will watch your condition.  Will get help right away if you are not doing well or get worse.   This information is not intended to replace advice given to you by your health care provider. Make sure you discuss any questions you have with your health care provider.   Document Released: 03/20/2000 Document Revised: 01/11/2013 Document Reviewed: 11/02/2012 Elsevier Interactive Patient Education [employer].

## 2015-08-28 NOTE — [facility] Provider Note (Signed)
Sara Shannon is a 35 yo, G3P1001 at 25.6 wks presenting to [facility]U announced with  Complaints of general malaise, diarrhea, and abdominal cramping, and low pelvic pressure that started on Sunday will general abdominal discomfort, vomiting and soft stools. Also complains of frequent heartburn that is nor relieved with TUMS and "drinking milk".   Reports good feta movement, no lof, no vb and some whitish discharge.    Next prenatal visit scheduled with [facility] on 09/09/15.   Repeat cesarean section scheduled for  11/29/15.   Most recent labs indicate normal Free T4 on 08/12/15     History     Patient Active Problem List   Diagnosis Date Noted  . Hx of cesarean section 08/28/2015  . History of miscarriage, currently pregnant 08/28/2015  . Anxiety disorder 08/28/2015  . Hypothyroidism 08/28/2015  . Vitamin D deficiency 08/28/2015  . Rubella non-immune status, antepartum 08/28/2015  . Gastroenteritis 08/28/2015  . GERD (gastroesophageal reflux disease) 08/28/2015    Chief Complaint  Patient presents with  . Abdominal Pain  . Diarrhea   HPI  OB History    Gravida Para Term Preterm AB TAB SAB Ectopic Multiple Living   3 1 1       1       Past Medical History  Diagnosis Date  . Abnormal Pap smear of cervix 06/2009    pos HR HPV with colpo--"precancerous cells" found per pt. 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 Thyroid disease     middle school, returned to normal after childbirth  . Hypothyroidism     Past Surgical History  Procedure Laterality Date  . Tonsillectomy and adenoidectomy  1992  . Cesarean section  10/20/02  . Intrauterine device insertion  06/14/09    Mirena  . Colposcopy  06/2009    "precancerous cells" on biopsy    Family History  Problem Relation Age of Onset  . Stroke Father 50    blocked carotid artery, left side  . Cancer Maternal Grandmother     lung, smoker  . Emphysema Maternal Grandmother     smoker  . Stroke  Maternal Grandfather 104  . Dementia Paternal Grandmother   . Cancer Paternal Grandfather     lung, smoker?    Social History  Substance Use Topics  . Smoking status: Current Every Day Smoker -- 0.50 packs/day    Types: Cigarettes  . Smokeless tobacco: Never Used  . Alcohol Use: No    Allergies: No Known Allergies  Prescriptions prior to admission  Medication Sig Dispense Refill Last Dose  . busPIRone (BUSPAR) 5 MG tablet Take 5 mg by mouth daily as needed (anxiety).   Past Month at Unknown time  . calcium carbonate (TUMS - DOSED IN MG ELEMENTAL CALCIUM) 500 MG chewable tablet Chew 1 tablet by mouth 2 (two) times daily as needed for indigestion or heartburn.   08/27/2015 at Unknown time  . Prenatal Vit-Fe Fumarate-FA (PRENATAL MULTIVITAMIN) TABS tablet Take 1 tablet by mouth at bedtime.    08/27/2015 at Unknown time  . Vitamin D, Ergocalciferol, (DRISDOL) 50000 UNITS CAPS capsule Take 1 capsule (50,000 Units total) by mouth every 7 (seven) days. 30 capsule 1 Past Month at Unknown time  . butalbital-acetaminophen-caffeine (FIORICET) 50-325-40 MG tablet Take 1-2 tablets by mouth every 6 (six) hours as needed for headache or migraine. 45 tablet 2 PRN    ROS Physical Exam   Blood pressure 117/59, pulse 87, temperature 98.3 F (  36.8 C), temperature source Oral, resp. rate 18, last menstrual period 02/28/2015, unknown if currently breastfeeding.   Results for orders placed or performed during the hospital encounter of 08/28/15 (from the past 24 hour(s))  Urinalysis, Routine w reflex microscopic (not at [facility])     Status: Abnormal   Collection Time: 08/28/15  8:40 PM  Result Value Ref Range   Color, Urine YELLOW YELLOW   APPearance CLEAR CLEAR   Specific Gravity, Urine <1.005 (L) 1.005 - 1.030   pH 6.0 5.0 - 8.0   Glucose, UA NEGATIVE NEGATIVE mg/dL   Hgb urine dipstick NEGATIVE NEGATIVE   Bilirubin Urine NEGATIVE NEGATIVE   Ketones, ur NEGATIVE NEGATIVE mg/dL   Protein, ur NEGATIVE  NEGATIVE mg/dL   Nitrite NEGATIVE NEGATIVE   Leukocytes, UA SMALL (A) NEGATIVE  Urine microscopic-add on     Status: Abnormal   Collection Time: 08/28/15  8:40 PM  Result Value Ref Range   Squamous Epithelial / LPF 6-30 (A) NONE SEEN   WBC, UA 0-5 0 - 5 WBC/hpf   RBC / HPF 0-5 0 - 5 RBC/hpf   Bacteria, UA RARE (A) NONE SEEN  Wet prep, genital     Status: Abnormal   Collection Time: 08/28/15 10:07 PM  Result Value Ref Range   Yeast Wet Prep HPF POC NONE SEEN NONE SEEN   Trich, Wet Prep NONE SEEN NONE SEEN   Clue Cells Wet Prep HPF POC NONE SEEN NONE SEEN   WBC, Wet Prep HPF POC MODERATE (A) NONE SEEN   Sperm NONE SEEN   CBC     Status: Abnormal   Collection Time: 08/28/15 11:20 PM  Result Value Ref Range   WBC 10.3 4.0 - 10.5 K/uL   RBC 3.01 (L) 3.87 - 5.11 MIL/uL   Hemoglobin 9.9 (L) 12.0 - 15.0 g/dL   HCT 29.0 (L) 36.0 - 46.0 %   MCV 96.3 78.0 - 100.0 fL   MCH 32.9 26.0 - 34.0 pg   MCHC 34.1 30.0 - 36.0 g/dL   RDW 13.8 11.5 - 15.5 %   Platelets 211 150 - 400 K/uL   FHT:   Reassuring for gestational age, 150 bpm, +accels, occasional variables UC: none SVE:  Closed/thick   Physical Exam  Constitutional: She is oriented to person, place, and time. She appears well-developed.  HENT:  Head: Normocephalic.  Eyes: Pupils are equal, round, and reactive to light.  Neck: Normal range of motion.  Cardiovascular: Normal rate and regular rhythm.   Respiratory: Effort normal and breath sounds normal.  GI: Soft. There is no tenderness. There is no CVA tenderness.  Genitourinary: Vagina normal. Cervix exhibits no motion tenderness, no discharge and no friability.  Musculoskeletal: Normal range of motion.  Neurological: She is alert and oriented to person, place, and time.  Skin: Skin is warm and dry.  Psychiatric: She has a normal mood and affect. Her behavior is normal.    ED Course  Assessment: Gastroenterisitis  GERD Round ligament pain Anemia of pregnancy GC/CH  pending Reassuring fetal tracing  Interrum Plan: IV Hydration IV zoran IV Protonix  Plan:  DC home in stable condition RX Protonix 40 mg daily Rx Zofran, 4mg  PO RX FE Po daily Discharge instructions for  GERD, Food choices for GERD, Anemia in pregnancy, gastroenteritis and round ligament pain Call PRN Keep scheduled Prenatal appointment  Lavetta Nielsen CNM, MSN 08/28/2015 10:18 PM

## 2015-08-29 DIAGNOSIS — O99282 Endocrine, nutritional and metabolic diseases complicating pregnancy, second trimester: Secondary | ICD-10-CM | POA: Diagnosis not present

## 2015-08-29 DIAGNOSIS — F419 Anxiety disorder, unspecified: Secondary | ICD-10-CM | POA: Diagnosis not present

## 2015-08-29 DIAGNOSIS — O99512 Diseases of the respiratory system complicating pregnancy, second trimester: Secondary | ICD-10-CM | POA: Diagnosis not present

## 2015-08-29 DIAGNOSIS — K210 Gastro-esophageal reflux disease with esophagitis: Secondary | ICD-10-CM | POA: Diagnosis not present

## 2015-08-29 DIAGNOSIS — O34219 Maternal care for unspecified type scar from previous cesarean delivery: Secondary | ICD-10-CM | POA: Diagnosis not present

## 2015-08-29 DIAGNOSIS — E559 Vitamin D deficiency, unspecified: Secondary | ICD-10-CM | POA: Diagnosis not present

## 2015-08-29 DIAGNOSIS — F17210 Nicotine dependence, cigarettes, uncomplicated: Secondary | ICD-10-CM | POA: Diagnosis not present

## 2015-08-29 DIAGNOSIS — O99342 Other mental disorders complicating pregnancy, second trimester: Secondary | ICD-10-CM | POA: Diagnosis not present

## 2015-08-29 DIAGNOSIS — E039 Hypothyroidism, unspecified: Secondary | ICD-10-CM | POA: Diagnosis not present

## 2015-08-29 LAB — GC/CHLAMYDIA PROBE AMP (~~LOC~~) NOT AT ARMC
Chlamydia: NEGATIVE
Neisseria Gonorrhea: NEGATIVE

## 2015-08-29 MED ORDER — PANTOPRAZOLE SODIUM 40 MG PO TBEC: 40.0000 mg | DELAYED_RELEASE_TABLET | Freq: Every day | ORAL | Status: DC

## 2015-08-29 MED ORDER — ONDANSETRON HCL 4 MG PO TABS: 4.0000 mg | ORAL_TABLET | Freq: Three times a day (TID) | ORAL | Status: DC | PRN

## 2015-08-29 MED ORDER — FERROUS GLUCONATE 324 (38 FE) MG PO TABS: 324.0000 mg | ORAL_TABLET | Freq: Every day | ORAL | Status: DC

## 2015-09-11 DIAGNOSIS — Z3482 Encounter for supervision of other normal pregnancy, second trimester: Secondary | ICD-10-CM | POA: Diagnosis not present

## 2015-09-11 DIAGNOSIS — Z3A27 27 weeks gestation of pregnancy: Secondary | ICD-10-CM | POA: Diagnosis not present

## 2015-09-11 DIAGNOSIS — Z36 Encounter for antenatal screening of mother: Secondary | ICD-10-CM | POA: Diagnosis not present

## 2015-09-11 DIAGNOSIS — Z23 Encounter for immunization: Secondary | ICD-10-CM | POA: Diagnosis not present

## 2015-09-11 LAB — OB RESULTS CONSOLE HIV ANTIBODY (ROUTINE TESTING): HIV: NONREACTIVE

## 2015-09-25 DIAGNOSIS — Z3482 Encounter for supervision of other normal pregnancy, second trimester: Secondary | ICD-10-CM | POA: Diagnosis not present

## 2015-09-25 DIAGNOSIS — Z3483 Encounter for supervision of other normal pregnancy, third trimester: Secondary | ICD-10-CM | POA: Diagnosis not present

## 2015-09-25 DIAGNOSIS — Z3A29 29 weeks gestation of pregnancy: Secondary | ICD-10-CM | POA: Diagnosis not present

## 2015-09-25 DIAGNOSIS — O368199 Decreased fetal movements, unspecified trimester, other fetus: Secondary | ICD-10-CM | POA: Diagnosis not present

## 2015-09-26 DIAGNOSIS — F17200 Nicotine dependence, unspecified, uncomplicated: Secondary | ICD-10-CM | POA: Diagnosis not present

## 2015-09-26 DIAGNOSIS — R8290 Unspecified abnormal findings in urine: Secondary | ICD-10-CM | POA: Diagnosis not present

## 2015-09-26 DIAGNOSIS — R87610 Atypical squamous cells of undetermined significance on cytologic smear of cervix (ASC-US): Secondary | ICD-10-CM | POA: Diagnosis not present

## 2015-09-26 DIAGNOSIS — R87619 Unspecified abnormal cytological findings in specimens from cervix uteri: Secondary | ICD-10-CM | POA: Diagnosis not present

## 2015-09-26 DIAGNOSIS — Z3A30 30 weeks gestation of pregnancy: Secondary | ICD-10-CM | POA: Diagnosis not present

## 2015-09-26 DIAGNOSIS — Z3493 Encounter for supervision of normal pregnancy, unspecified, third trimester: Secondary | ICD-10-CM | POA: Diagnosis not present

## 2015-10-10 DIAGNOSIS — F17200 Nicotine dependence, unspecified, uncomplicated: Secondary | ICD-10-CM | POA: Diagnosis not present

## 2015-10-10 DIAGNOSIS — Z3483 Encounter for supervision of other normal pregnancy, third trimester: Secondary | ICD-10-CM | POA: Diagnosis not present

## 2015-10-10 DIAGNOSIS — Z3A32 32 weeks gestation of pregnancy: Secondary | ICD-10-CM | POA: Diagnosis not present

## 2015-10-10 DIAGNOSIS — E039 Hypothyroidism, unspecified: Secondary | ICD-10-CM | POA: Diagnosis not present

## 2015-10-21 ENCOUNTER — Other Ambulatory Visit: Payer: Self-pay | Admitting: Obstetrics and Gynecology

## 2015-10-24 ENCOUNTER — Encounter (HOSPITAL_COMMUNITY): Payer: Self-pay

## 2015-10-24 ENCOUNTER — Inpatient Hospital Stay (HOSPITAL_COMMUNITY)
Admission: AD | Admit: 2015-10-24 | Discharge: 2015-10-24 | Disposition: A | Discharge status: Home / Self Care | Payer: 59 | Source: Ambulatory Visit | Attending: Obstetrics and Gynecology | Admitting: Obstetrics and Gynecology

## 2015-10-24 DIAGNOSIS — Z3493 Encounter for supervision of normal pregnancy, unspecified, third trimester: Secondary | ICD-10-CM | POA: Diagnosis not present

## 2015-10-24 DIAGNOSIS — O99333 Smoking (tobacco) complicating pregnancy, third trimester: Secondary | ICD-10-CM | POA: Diagnosis not present

## 2015-10-24 DIAGNOSIS — Z3A34 34 weeks gestation of pregnancy: Secondary | ICD-10-CM | POA: Diagnosis not present

## 2015-10-24 DIAGNOSIS — Z3689 Encounter for other specified antenatal screening: Secondary | ICD-10-CM

## 2015-10-24 DIAGNOSIS — O368130 Decreased fetal movements, third trimester, not applicable or unspecified: Secondary | ICD-10-CM | POA: Insufficient documentation

## 2015-10-24 DIAGNOSIS — O26899 Other specified pregnancy related conditions, unspecified trimester: Secondary | ICD-10-CM | POA: Diagnosis not present

## 2015-10-24 LAB — URINALYSIS, ROUTINE W REFLEX MICROSCOPIC
BILIRUBIN URINE: NEGATIVE
GLUCOSE, UA: NEGATIVE mg/dL
Hgb urine dipstick: NEGATIVE
KETONES UR: NEGATIVE mg/dL
Leukocytes, UA: NEGATIVE
NITRITE: NEGATIVE
PH: 6.5 (ref 5.0–8.0)
PROTEIN: NEGATIVE mg/dL
Specific Gravity, Urine: <1.005 — ABNORMAL LOW (ref 1.005–1.030)

## 2015-10-24 NOTE — Discharge Instructions (Signed)
Fetal Movement Counts  Patient Name: __________________________________________________ Patient Due Date: ____________________  Performing a fetal movement count is highly recommended in high-risk pregnancies, but it is good for every pregnant woman to do. Your health care provider may ask you to start counting fetal movements at 28 weeks of the pregnancy. Fetal movements often increase:  · After eating a full meal.  · After physical activity.  · After eating or drinking something sweet or cold.  · At rest.  Pay attention to when you feel the baby is most active. This will help you notice a pattern of your baby's sleep and wake cycles and what factors contribute to an increase in fetal movement. It is important to perform a fetal movement count at the same time each day when your baby is normally most active.   HOW TO COUNT FETAL MOVEMENTS  1. Find a quiet and comfortable area to sit or lie down on your left side. Lying on your left side provides the best blood and oxygen circulation to your baby.  2. Write down the day and time on a sheet of paper or in a journal.  3. Start counting kicks, flutters, swishes, rolls, or jabs in a 2-hour period. You should feel at least 10 movements within 2 hours.  4. If you do not feel 10 movements in 2 hours, wait 2-3 hours and count again. Look for a change in the pattern or not enough counts in 2 hours.  SEEK MEDICAL CARE IF:  · You feel less than 10 counts in 2 hours, tried twice.  · There is no movement in over an hour.  · The pattern is changing or taking longer each day to reach 10 counts in 2 hours.  · You feel the baby is not moving as he or she usually does.  Date: ____________ Movements: ____________ Start time: ____________ Finish time: ____________   Date: ____________ Movements: ____________ Start time: ____________ Finish time: ____________  Date: ____________ Movements: ____________ Start time: ____________ Finish time: ____________  Date: ____________ Movements:  ____________ Start time: ____________ Finish time: ____________  Date: ____________ Movements: ____________ Start time: ____________ Finish time: ____________  Date: ____________ Movements: ____________ Start time: ____________ Finish time: ____________  Date: ____________ Movements: ____________ Start time: ____________ Finish time: ____________  Date: ____________ Movements: ____________ Start time: ____________ Finish time: ____________   Date: ____________ Movements: ____________ Start time: ____________ Finish time: ____________  Date: ____________ Movements: ____________ Start time: ____________ Finish time: ____________  Date: ____________ Movements: ____________ Start time: ____________ Finish time: ____________  Date: ____________ Movements: ____________ Start time: ____________ Finish time: ____________  Date: ____________ Movements: ____________ Start time: ____________ Finish time: ____________  Date: ____________ Movements: ____________ Start time: ____________ Finish time: ____________  Date: ____________ Movements: ____________ Start time: ____________ Finish time: ____________   Date: ____________ Movements: ____________ Start time: ____________ Finish time: ____________  Date: ____________ Movements: ____________ Start time: ____________ Finish time: ____________  Date: ____________ Movements: ____________ Start time: ____________ Finish time: ____________  Date: ____________ Movements: ____________ Start time: ____________ Finish time: ____________  Date: ____________ Movements: ____________ Start time: ____________ Finish time: ____________  Date: ____________ Movements: ____________ Start time: ____________ Finish time: ____________  Date: ____________ Movements: ____________ Start time: ____________ Finish time: ____________   Date: ____________ Movements: ____________ Start time: ____________ Finish time: ____________  Date: ____________ Movements: ____________ Start time: ____________ Finish  time: ____________  Date: ____________ Movements: ____________ Start time: ____________ Finish time: ____________  Date: ____________ Movements: ____________ Start time:   ____________ Finish time: ____________  Date: ____________ Movements: ____________ Start time: ____________ Finish time: ____________  Date: ____________ Movements: ____________ Start time: ____________ Finish time: ____________  Date: ____________ Movements: ____________ Start time: ____________ Finish time: ____________   Date: ____________ Movements: ____________ Start time: ____________ Finish time: ____________  Date: ____________ Movements: ____________ Start time: ____________ Finish time: ____________  Date: ____________ Movements: ____________ Start time: ____________ Finish time: ____________  Date: ____________ Movements: ____________ Start time: ____________ Finish time: ____________  Date: ____________ Movements: ____________ Start time: ____________ Finish time: ____________  Date: ____________ Movements: ____________ Start time: ____________ Finish time: ____________  Date: ____________ Movements: ____________ Start time: ____________ Finish time: ____________   Date: ____________ Movements: ____________ Start time: ____________ Finish time: ____________  Date: ____________ Movements: ____________ Start time: ____________ Finish time: ____________  Date: ____________ Movements: ____________ Start time: ____________ Finish time: ____________  Date: ____________ Movements: ____________ Start time: ____________ Finish time: ____________  Date: ____________ Movements: ____________ Start time: ____________ Finish time: ____________  Date: ____________ Movements: ____________ Start time: ____________ Finish time: ____________  Date: ____________ Movements: ____________ Start time: ____________ Finish time: ____________   Date: ____________ Movements: ____________ Start time: ____________ Finish time: ____________  Date: ____________  Movements: ____________ Start time: ____________ Finish time: ____________  Date: ____________ Movements: ____________ Start time: ____________ Finish time: ____________  Date: ____________ Movements: ____________ Start time: ____________ Finish time: ____________  Date: ____________ Movements: ____________ Start time: ____________ Finish time: ____________  Date: ____________ Movements: ____________ Start time: ____________ Finish time: ____________  Date: ____________ Movements: ____________ Start time: ____________ Finish time: ____________   Date: ____________ Movements: ____________ Start time: ____________ Finish time: ____________  Date: ____________ Movements: ____________ Start time: ____________ Finish time: ____________  Date: ____________ Movements: ____________ Start time: ____________ Finish time: ____________  Date: ____________ Movements: ____________ Start time: ____________ Finish time: ____________  Date: ____________ Movements: ____________ Start time: ____________ Finish time: ____________  Date: ____________ Movements: ____________ Start time: ____________ Finish time: ____________     This information is not intended to replace advice given to you by your health care provider. Make sure you discuss any questions you have with your health care provider.     Document Released: 04/22/2006 Document Revised: 04/13/2014 Document Reviewed: 01/18/2012  Elsevier Interactive Patient Education ©2016 [employer]

## 2015-10-24 NOTE — MAU Note (Signed)
Patient presents with c/o of decreased fetal movement all day. Patient also states that her back hurst and she has not felt well all week and is not sure if she is doing to much at work.

## 2015-10-24 NOTE — MAU Provider Note (Signed)
History     CSN: [id number]  Arrival date and time: 10/24/15 2120   First Provider Initiated Contact with Patient 10/24/15 2150      Chief Complaint  Patient presents with  . Decreased Fetal Movement   HPI Comments: Sara Shannon is a 35 y.o. G3P1001 at [redacted]w[redacted]d who presents today with decreased fetal movement. She states that prior to arrival she had not felt the baby move since earlier today. She reports that since she has been here she has been feeling normal fetal movement. She denies any VB or LOF. She denies any contractions.    Past Medical History  Diagnosis Date  . Abnormal Pap smear of cervix 06/2009    pos HR HPV with colpo--"precancerous cells" found per pt. 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 Thyroid disease     middle school, returned to normal after childbirth  . Hypothyroidism     Past Surgical History  Procedure Laterality Date  . Tonsillectomy and adenoidectomy  1992  . Cesarean section  10/20/02  . Intrauterine device insertion  06/14/09    Mirena  . Colposcopy  06/2009    "precancerous cells" on biopsy    Family History  Problem Relation Age of Onset  . Stroke Father 68    blocked carotid artery, left side  . Cancer Maternal Grandmother     lung, smoker  . Emphysema Maternal Grandmother     smoker  . Stroke Maternal Grandfather 16  . Dementia Paternal Grandmother   . Cancer Paternal Grandfather     lung, smoker?    Social History  Substance Use Topics  . Smoking status: Current Every Day Smoker -- 0.50 packs/day    Types: Cigarettes  . Smokeless tobacco: Never Used  . Alcohol Use: No    Allergies: No Known Allergies  Prescriptions prior to admission  Medication Sig Dispense Refill Last Dose  . busPIRone (BUSPAR) 5 MG tablet Take 5 mg by mouth daily as needed (anxiety).   Past Month at Unknown time  . butalbital-acetaminophen-caffeine (FIORICET) 50-325-40 MG tablet Take 1-2 tablets by mouth every 6 (six) hours  as needed for headache or migraine. 45 tablet 2 PRN  . ferrous gluconate (FERGON) 324 MG tablet Take 1 tablet (324 mg total) by mouth daily with breakfast. 30 tablet 3   . ondansetron (ZOFRAN) 4 MG tablet Take 1 tablet (4 mg total) by mouth every 8 (eight) hours as needed for nausea or vomiting. 20 tablet 1   . pantoprazole (PROTONIX) 40 MG tablet Take 1 tablet (40 mg total) by mouth daily. 30 tablet 1   . Prenatal Vit-Fe Fumarate-FA (PRENATAL MULTIVITAMIN) TABS tablet Take 1 tablet by mouth at bedtime.    08/27/2015 at Unknown time  . Vitamin D, Ergocalciferol, (DRISDOL) 50000 UNITS CAPS capsule Take 1 capsule (50,000 Units total) by mouth every 7 (seven) days. 30 capsule 1 Past Month at Unknown time    Review of Systems  Constitutional: Negative for fever and chills.  Gastrointestinal: Negative for nausea, vomiting, abdominal pain, diarrhea and constipation.  Genitourinary: Negative for dysuria, urgency and frequency.   Physical Exam   Blood pressure 124/74, pulse 82, temperature 98.7 F (37.1 C), temperature source Oral, resp. rate 18, last menstrual period 02/28/2015, unknown if currently breastfeeding.  Physical Exam  Nursing note and vitals reviewed. Constitutional: She is oriented to person, place, and time. She appears well-developed and well-nourished. No distress.  HENT:  Head: Normocephalic.  Cardiovascular:  Normal rate.   Respiratory: Effort normal.  GI: Soft. There is no tenderness. There is no rebound.  Neurological: She is alert and oriented to person, place, and time.  Skin: Skin is warm and dry.  Psychiatric: She has a normal mood and affect.   Results for orders placed or performed during the hospital encounter of 10/24/15 (from the past 24 hour(s))  Urinalysis, Routine w reflex microscopic (not at [facility])     Status: Abnormal   Collection Time: 10/24/15  9:25 PM  Result Value Ref Range   Color, Urine YELLOW YELLOW   APPearance HAZY (A) CLEAR   Specific Gravity,  Urine <1.005 (L) 1.005 - 1.030   pH 6.5 5.0 - 8.0   Glucose, UA NEGATIVE NEGATIVE mg/dL   Hgb urine dipstick NEGATIVE NEGATIVE   Bilirubin Urine NEGATIVE NEGATIVE   Ketones, ur NEGATIVE NEGATIVE mg/dL   Protein, ur NEGATIVE NEGATIVE mg/dL   Nitrite NEGATIVE NEGATIVE   Leukocytes, UA NEGATIVE NEGATIVE   FHT: 135, moderate with 15x15 accels, no decels Toco: no UCs  MAU Course  Procedures  MDM 2214: D/W Dr. Simona Huh, ok for DC home.   Assessment and Plan   1. Decreased fetal movement, third trimester, not applicable or unspecified fetus   2. NST (non-stress test) reactive    DC home 3rd Trimester precautions  PTL precautions  Fetal kick counts RX: none  Return to MAU as needed FU with OB as planned  Follow-up Information    Schedule an appointment as soon as possible for a visit with [facility] A, MD.   Specialty:  Obstetrics and Gynecology   Why:  As scheduled   Contact information:   [address] [place] [address] [phone]         Mathis Bud 10/24/2015, 9:51 PM

## 2015-10-31 ENCOUNTER — Encounter (HOSPITAL_COMMUNITY): Payer: Self-pay

## 2015-10-31 ENCOUNTER — Inpatient Hospital Stay (HOSPITAL_COMMUNITY)
Admission: AD | Admit: 2015-10-31 | Discharge: 2015-10-31 | Disposition: A | Discharge status: Home / Self Care | Payer: 59 | Source: Ambulatory Visit | Attending: Obstetrics and Gynecology | Admitting: Obstetrics and Gynecology

## 2015-10-31 DIAGNOSIS — R112 Nausea with vomiting, unspecified: Secondary | ICD-10-CM | POA: Diagnosis present

## 2015-10-31 DIAGNOSIS — O210 Mild hyperemesis gravidarum: Secondary | ICD-10-CM | POA: Diagnosis not present

## 2015-10-31 DIAGNOSIS — Z3A35 35 weeks gestation of pregnancy: Secondary | ICD-10-CM | POA: Insufficient documentation

## 2015-10-31 DIAGNOSIS — Z79899 Other long term (current) drug therapy: Secondary | ICD-10-CM | POA: Diagnosis not present

## 2015-10-31 DIAGNOSIS — O368130 Decreased fetal movements, third trimester, not applicable or unspecified: Secondary | ICD-10-CM | POA: Insufficient documentation

## 2015-10-31 DIAGNOSIS — O219 Vomiting of pregnancy, unspecified: Secondary | ICD-10-CM | POA: Diagnosis not present

## 2015-10-31 DIAGNOSIS — O99333 Smoking (tobacco) complicating pregnancy, third trimester: Secondary | ICD-10-CM | POA: Diagnosis not present

## 2015-10-31 DIAGNOSIS — F17200 Nicotine dependence, unspecified, uncomplicated: Secondary | ICD-10-CM | POA: Insufficient documentation

## 2015-10-31 DIAGNOSIS — O99283 Endocrine, nutritional and metabolic diseases complicating pregnancy, third trimester: Secondary | ICD-10-CM | POA: Insufficient documentation

## 2015-10-31 DIAGNOSIS — E039 Hypothyroidism, unspecified: Secondary | ICD-10-CM | POA: Diagnosis not present

## 2015-10-31 LAB — URINALYSIS, ROUTINE W REFLEX MICROSCOPIC
BILIRUBIN URINE: NEGATIVE
BILIRUBIN URINE: NEGATIVE
Glucose, UA: NEGATIVE mg/dL
Glucose, UA: NEGATIVE mg/dL
Hgb urine dipstick: NEGATIVE
Hgb urine dipstick: NEGATIVE
Ketones, ur: >80 mg/dL — AB
Leukocytes, UA: NEGATIVE
NITRITE: NEGATIVE
NITRITE: NEGATIVE
PH: 6 (ref 5.0–8.0)
PH: 5.5 (ref 5.0–8.0)
Protein, ur: NEGATIVE mg/dL
Protein, ur: NEGATIVE mg/dL
SPECIFIC GRAVITY, URINE: 1.01 (ref 1.005–1.030)
Specific Gravity, Urine: 1.015 (ref 1.005–1.030)

## 2015-10-31 LAB — CBC
HCT: 31.7 % — ABNORMAL LOW (ref 36.0–46.0)
HEMOGLOBIN: 10.8 g/dL — AB (ref 12.0–15.0)
MCH: 32.2 pg (ref 26.0–34.0)
MCHC: 34.1 g/dL (ref 30.0–36.0)
MCV: 94.6 fL (ref 78.0–100.0)
PLATELETS: 193 10*3/uL (ref 150–400)
RBC: 3.35 MIL/uL — AB (ref 3.87–5.11)
RDW: 13.9 % (ref 11.5–15.5)
WBC: 7.5 10*3/uL (ref 4.0–10.5)

## 2015-10-31 LAB — AMYLASE: Amylase: 23 U/L — ABNORMAL LOW (ref 28–100)

## 2015-10-31 LAB — URINE MICROSCOPIC-ADD ON

## 2015-10-31 LAB — COMPREHENSIVE METABOLIC PANEL
ALK PHOS: 94 U/L (ref 38–126)
ALT: 19 U/L (ref 14–54)
AST: 16 U/L (ref 15–41)
Albumin: 2.8 g/dL — ABNORMAL LOW (ref 3.5–5.0)
Anion gap: 8 (ref 5–15)
BILIRUBIN TOTAL: 0.9 mg/dL (ref 0.3–1.2)
BUN: 6 mg/dL (ref 6–20)
CALCIUM: 8 mg/dL — AB (ref 8.9–10.3)
CO2: 24 mmol/L (ref 22–32)
CREATININE: 0.44 mg/dL (ref 0.44–1.00)
Chloride: 102 mmol/L (ref 101–111)
GLUCOSE: 95 mg/dL (ref 65–99)
Potassium: 3.4 mmol/L — ABNORMAL LOW (ref 3.5–5.1)
Sodium: 134 mmol/L — ABNORMAL LOW (ref 135–145)
Total Protein: 6 g/dL — ABNORMAL LOW (ref 6.5–8.1)

## 2015-10-31 LAB — LIPASE, BLOOD: LIPASE: 13 U/L (ref 11–51)

## 2015-10-31 MED ORDER — ONDANSETRON 4 MG PO FILM: 1.0000 | ORAL_FILM | Freq: Three times a day (TID) | ORAL | 0 refills | Status: DC | PRN

## 2015-10-31 MED ORDER — LACTATED RINGERS IV BOLUS (SEPSIS)
1000.0000 mL | Freq: Once | INTRAVENOUS | Status: AC
  Administered 2015-10-31: 1000 mL via INTRAVENOUS

## 2015-10-31 MED ORDER — ONDANSETRON HCL 4 MG/2ML IJ SOLN
4.0000 mg | Freq: Once | INTRAMUSCULAR | Status: AC
  Administered 2015-10-31: 4 mg via INTRAVENOUS
  Filled 2015-10-31: qty 2

## 2015-10-31 MED ORDER — LACTATED RINGERS IV BOLUS (SEPSIS): 1000.0000 mL | Freq: Once | INTRAVENOUS | Status: DC

## 2015-10-31 NOTE — Discharge Instructions (Signed)
Hyperemesis Gravidarum  Hyperemesis gravidarum is a severe form of nausea and vomiting that happens during pregnancy. Hyperemesis is worse than morning sickness. It may cause you to have nausea or vomiting all day for many days. It may keep you from eating and drinking enough food and liquids. Hyperemesis usually occurs during the first half (the first 20 weeks) of pregnancy. It often goes away once a woman is in her second half of pregnancy. However, sometimes hyperemesis continues through an entire pregnancy.   CAUSES   The cause of this condition is not completely known but is thought to be related to changes in the body's hormones when pregnant. It could be from the high level of the pregnancy hormone or an increase in estrogen in the body.   SIGNS AND SYMPTOMS    Severe nausea and vomiting.   Nausea that does not go away.   Vomiting that does not allow you to keep any food down.   Weight loss and body fluid loss (dehydration).   Having no desire to eat or not liking food you have previously enjoyed.  DIAGNOSIS   Your health care provider will do a physical exam and ask you about your symptoms. He or she may also order blood tests and urine tests to make sure something else is not causing the problem.   TREATMENT   You may only need medicine to control the problem. If medicines do not control the nausea and vomiting, you will be treated in the hospital to prevent dehydration, increased acid in the blood (acidosis), weight loss, and changes in the electrolytes in your body that may harm the unborn baby (fetus). You may need IV fluids.   HOME CARE INSTRUCTIONS    Only take over-the-counter or prescription medicines as directed by your health care provider.   Try eating a couple of dry crackers or toast in the morning before getting out of bed.   Avoid foods and smells that upset your stomach.   Avoid fatty and spicy foods.   Eat 5-6 small meals a day.   Do not drink when eating meals. Drink between  meals.   For snacks, eat high-protein foods, such as cheese.   Eat or suck on things that have ginger in them. Ginger helps nausea.   Avoid food preparation. The smell of food can spoil your appetite.   Avoid iron pills and iron in your multivitamins until after 3-4 months of being pregnant. However, consult with your health care provider before stopping any prescribed iron pills.  SEEK MEDICAL CARE IF:    Your abdominal pain increases.   You have a severe headache.   You have vision problems.   You are losing weight.  SEEK IMMEDIATE MEDICAL CARE IF:    You are unable to keep fluids down.   You vomit blood.   You have constant nausea and vomiting.   You have excessive weakness.   You have extreme thirst.   You have dizziness or fainting.   You have a fever or persistent symptoms for more than 2-3 days.   You have a fever and your symptoms suddenly get worse.  MAKE SURE YOU:    Understand these instructions.   Will watch your condition.   Will get help right away if you are not doing well or get worse.     This information is not intended to replace advice given to you by your health care provider. Make sure you discuss any questions you have with 

## 2015-10-31 NOTE — MAU Note (Signed)
Patient presents with c/o of N/V for the past 2 days. Patient states that she has had bad heart burn and has been taking pepcid but for the past 2 day she has gotten no relief and has been unable to sleep. Patient states that she through up last night and a gush of fluid came out. Patient states that since then she hasn't felt much fetal movement. Patient denies and bleeding.

## 2015-10-31 NOTE — MAU Note (Signed)
Urine in lab 

## 2015-10-31 NOTE — MAU Provider Note (Signed)
Chief Complaint:  Nausea and Emesis During Pregnancy   First Provider Initiated Contact with Patient 10/31/2015 at 4:27PM.     HPI: Sara Shannon is a 35 y.o. G3P1011 at [redacted]w[redacted]d who presents to maternity admissions reporting significant nausea/vomiting/heartburn and decreased fetal movement.  States she cannot hold anything down, even small sips of water, for the past 3 days. She has tried ginger ale, protonix, tums, and milk. No relief. She states she hasn't slept for the past three days either due to the heartburn, states she's tried to sleep sitting up in chairs, couch, and recliner, and unable to sleep.   Denies contractions, leakage of fluid or vaginal bleeding.    Pregnancy Course:  Per patient uncomplicated. Not on any meds for hypothyroidism per chart.  Past Medical History: Past Medical History:  Diagnosis Date  . Abnormal Pap smear of cervix 06/2009   pos HR HPV with colpo--"precancerous cells" found per pt. 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 Hypothyroidism   . Thyroid disease    middle school, returned to normal after childbirth    Past obstetric history: OB History  Gravida Para Term Preterm AB Living  3 1 1   1 1   SAB TAB Ectopic Multiple Live Births  1            # Outcome Date GA Lbr Len/2nd Weight Sex Delivery Anes PTL Lv  3 Current           2 Term 10/20/02 [redacted]w[redacted]d   M CS-Unspec EPI N LIV     Complications: Failure to Progress in Second Stage  1 SAB               Past Surgical History: Past Surgical History:  Procedure Laterality Date  . CESAREAN SECTION  10/20/02  . COLPOSCOPY  06/2009   "precancerous cells" on biopsy  . INTRAUTERINE DEVICE INSERTION  06/14/09   Mirena  . TONSILLECTOMY AND ADENOIDECTOMY  1992     Family History: Family History  Problem Relation Age of Onset  . Stroke Father 2    blocked carotid artery, left side  . Cancer Maternal Grandmother     lung, smoker  . Emphysema Maternal Grandmother     smoker   . Stroke Maternal Grandfather 36  . Dementia Paternal Grandmother   . Cancer Paternal Grandfather     lung, smoker?    Social History: Social History  Substance Use Topics  . Smoking status: Current Every Day Smoker    Packs/day: 0.50    Types: Cigarettes  . Smokeless tobacco: Never Used  . Alcohol use No    Allergies: No Known Allergies  Meds:  Prescriptions Prior to Admission  Medication Sig Dispense Refill Last Dose  . busPIRone (BUSPAR) 5 MG tablet Take 5 mg by mouth daily as needed (anxiety).   Past Month at Unknown time  . ferrous gluconate (FERGON) 324 MG tablet Take 1 tablet (324 mg total) by mouth daily with breakfast. 30 tablet 3 10/24/2015 at Unknown time  . ondansetron (ZOFRAN) 4 MG tablet Take 1 tablet (4 mg total) by mouth every 8 (eight) hours as needed for nausea or vomiting. 20 tablet 1 Past Month at Unknown time  . pantoprazole (PROTONIX) 40 MG tablet Take 1 tablet (40 mg total) by mouth daily. 30 tablet 1 10/24/2015 at Unknown time  . Prenatal Vit-Fe Fumarate-FA (PRENATAL MULTIVITAMIN) TABS tablet Take 1 tablet by mouth at bedtime.    10/24/2015  at Unknown time    I have reviewed patient's Past Medical Hx, Surgical Hx, Family Hx, Social Hx, medications and allergies.   ROS:  Review of Systems Review of Systems - Negative except stated in HPI: nausea, vomiting, diarrhea yesterday, cramping, not able to sleep.  Physical Exam  Patient Vitals for the past 24 hrs:  BP Temp Temp src Pulse Resp  10/31/15 1610 108/73 98.2 F (36.8 C) Oral 107 18   Constitutional: Well-developed, well-nourished female in no acute distress.  Cardiovascular: normal rate Respiratory: normal effort GI: Abd soft, non-tender, gravid appropriate for gestational age. Pos BS x 4 MS: Extremities nontender, no edema, normal ROM Neurologic: Alert and oriented x 4.  GU: Neg CVAT.  Pelvic: Did not perform.     FHT:  Baseline 130 , moderate variability, accelerations present, no  decelerations Contractions: None   Labs: Results for orders placed or performed during the hospital encounter of 10/31/15 (from the past 24 hour(s))  Urinalysis, Routine w reflex microscopic (not at [facility])     Status: Abnormal   Collection Time: 10/31/15  4:00 PM  Result Value Ref Range   Color, Urine YELLOW YELLOW   APPearance HAZY (A) CLEAR   Specific Gravity, Urine 1.015 1.005 - 1.030   pH 6.0 5.0 - 8.0   Glucose, UA NEGATIVE NEGATIVE mg/dL   Hgb urine dipstick NEGATIVE NEGATIVE   Bilirubin Urine NEGATIVE NEGATIVE   Ketones, ur >80 (A) NEGATIVE mg/dL   Protein, ur NEGATIVE NEGATIVE mg/dL   Nitrite NEGATIVE NEGATIVE   Leukocytes, UA SMALL (A) NEGATIVE  Urine microscopic-add on     Status: Abnormal   Collection Time: 10/31/15  4:00 PM  Result Value Ref Range   Squamous Epithelial / LPF 6-30 (A) NONE SEEN   WBC, UA 6-30 0 - 5 WBC/hpf   RBC / HPF 6-30 0 - 5 RBC/hpf   Bacteria, UA MANY (A) NONE SEEN    Imaging:  No results found.  MAU Course:  17:48: Checked on patient, patient reports feeling better and able to eat a cracker and drink water without nausea or vomiting, symptoms resolved. Spoke with Dr. Cletis Media, would like a repeat UA to show improvement of ketones and give another bolus of fluid. OK to d/c home if improved.    MDM:  After IVF (bolus x2) and IV Zofran was given, patient was able to tolerate PO intake and felt improved. All labs were negative except for large amounts of ketones in urine. Spoke with Dr. Mancel Bale who requested to have labwork performed before discharge, she will follow up with results.   Assessment: 1. Nausea and vomiting during pregnancy     Plan: Discharge home in stable condition.  Rx for Zofran dissolvable film if cannot tolerate tablets. Labor precautions and fetal kick counts     Medication List    ASK your doctor about these medications   busPIRone 5 MG tablet Commonly known as:  BUSPAR Take 5 mg by mouth daily as needed  (anxiety).   ferrous gluconate 324 MG tablet Commonly known as:  FERGON Take 1 tablet (324 mg total) by mouth daily with breakfast.   ondansetron 4 MG tablet Commonly known as:  ZOFRAN Take 1 tablet (4 mg total) by mouth every 8 (eight) hours as needed for nausea or vomiting.   pantoprazole 40 MG tablet Commonly known as:  PROTONIX Take 1 tablet (40 mg total) by mouth daily.   prenatal multivitamin Tabs tablet Take 1 tablet by mouth at bedtime.  [place], [place] 10/31/2015 4:33 PM

## 2015-11-01 ENCOUNTER — Other Ambulatory Visit: Payer: Self-pay | Admitting: Obstetrics and Gynecology

## 2015-11-01 ENCOUNTER — Observation Stay (HOSPITAL_COMMUNITY)
Admission: AD | Admit: 2015-11-01 | Discharge: 2015-11-03 | Disposition: A | Discharge status: Home / Self Care | Payer: 59 | Source: Ambulatory Visit | Attending: Obstetrics & Gynecology | Admitting: Obstetrics & Gynecology

## 2015-11-01 DIAGNOSIS — Z3A35 35 weeks gestation of pregnancy: Secondary | ICD-10-CM | POA: Insufficient documentation

## 2015-11-01 DIAGNOSIS — O99333 Smoking (tobacco) complicating pregnancy, third trimester: Secondary | ICD-10-CM | POA: Diagnosis not present

## 2015-11-01 DIAGNOSIS — O212 Late vomiting of pregnancy: Secondary | ICD-10-CM | POA: Diagnosis not present

## 2015-11-01 DIAGNOSIS — O99283 Endocrine, nutritional and metabolic diseases complicating pregnancy, third trimester: Secondary | ICD-10-CM | POA: Diagnosis not present

## 2015-11-01 DIAGNOSIS — O99613 Diseases of the digestive system complicating pregnancy, third trimester: Secondary | ICD-10-CM | POA: Diagnosis not present

## 2015-11-01 DIAGNOSIS — E039 Hypothyroidism, unspecified: Secondary | ICD-10-CM | POA: Insufficient documentation

## 2015-11-01 DIAGNOSIS — K219 Gastro-esophageal reflux disease without esophagitis: Secondary | ICD-10-CM | POA: Insufficient documentation

## 2015-11-01 DIAGNOSIS — F419 Anxiety disorder, unspecified: Secondary | ICD-10-CM | POA: Diagnosis not present

## 2015-11-01 DIAGNOSIS — R112 Nausea with vomiting, unspecified: Secondary | ICD-10-CM | POA: Diagnosis not present

## 2015-11-01 DIAGNOSIS — F17210 Nicotine dependence, cigarettes, uncomplicated: Secondary | ICD-10-CM | POA: Diagnosis not present

## 2015-11-01 DIAGNOSIS — O34211 Maternal care for low transverse scar from previous cesarean delivery: Secondary | ICD-10-CM | POA: Insufficient documentation

## 2015-11-01 DIAGNOSIS — K210 Gastro-esophageal reflux disease with esophagitis: Secondary | ICD-10-CM | POA: Diagnosis not present

## 2015-11-01 DIAGNOSIS — O99343 Other mental disorders complicating pregnancy, third trimester: Secondary | ICD-10-CM | POA: Diagnosis not present

## 2015-11-01 MED ORDER — LACTATED RINGERS IV BOLUS (SEPSIS)
500.0000 mL | Freq: Once | INTRAVENOUS | Status: AC
  Administered 2015-11-01: 500 mL via INTRAVENOUS

## 2015-11-01 MED ORDER — CALCIUM CARBONATE ANTACID 500 MG PO CHEW: 2.0000 | CHEWABLE_TABLET | ORAL | Status: DC | PRN

## 2015-11-01 MED ORDER — FAMOTIDINE IN NACL 20-0.9 MG/50ML-% IV SOLN
20.0000 mg | Freq: Two times a day (BID) | INTRAVENOUS | Status: DC
  Administered 2015-11-02 (×2): 20 mg via INTRAVENOUS
  Filled 2015-11-01 (×3): qty 50

## 2015-11-01 MED ORDER — DOCUSATE SODIUM 100 MG PO CAPS
100.0000 mg | ORAL_CAPSULE | Freq: Every day | ORAL | Status: DC
  Administered 2015-11-03: 100 mg via ORAL
  Filled 2015-11-01: qty 1

## 2015-11-01 MED ORDER — ZOLPIDEM TARTRATE 5 MG PO TABS
5.0000 mg | ORAL_TABLET | Freq: Every evening | ORAL | Status: DC | PRN
  Administered 2015-11-02 (×2): 5 mg via ORAL
  Filled 2015-11-01 (×2): qty 1

## 2015-11-01 MED ORDER — ACETAMINOPHEN 325 MG PO TABS: 650.0000 mg | ORAL_TABLET | ORAL | Status: DC | PRN

## 2015-11-01 MED ORDER — LACTATED RINGERS IV SOLN
INTRAVENOUS | Status: DC
  Administered 2015-11-02 (×2): via INTRAVENOUS

## 2015-11-01 MED ORDER — PRENATAL MULTIVITAMIN CH
1.0000 | ORAL_TABLET | Freq: Every day | ORAL | Status: DC
  Administered 2015-11-02 – 2015-11-03 (×2): 1 via ORAL
  Filled 2015-11-01 (×2): qty 1

## 2015-11-01 NOTE — H&P (Signed)
Sara Shannon is a 35 y.o. female, G3P1011 at 39 2/7 weeks, presenting for admission due to persistent N/V this week--seen at [facility] 10/31/15 for same complaints, with >80 ketones.  Received IV hydration, Zofran, went home after tolerating chips and crackers.  Reports has been unable to keep food or fluids down today.  Denies fever, viral exposures, diarrhea, URI sx, contractions, leaking, bleeding, or dysuria.  Has struggled with reflux, feeling worse today.  On Protonix at home, did not take today.  Took Zofran earlier today.  Scheduled for repeat C/S 11/29/15.  Patient Active Problem List   Diagnosis Date Noted  . Hx of cesarean section 08/28/2015  . History of miscarriage, currently pregnant 08/28/2015  . Anxiety disorder 08/28/2015  . Hypothyroidism 08/28/2015  . Vitamin D deficiency 08/28/2015  . Rubella non-immune status, antepartum 08/28/2015  . Gastroenteritis 08/28/2015  . GERD (gastroesophageal reflux disease) 08/28/2015    History of present pregnancy: Patient entered care at 10 weeks.   EDC of 12/05/15 was established by LMP, in agreement with [place] at 7 1/7 weeks Anatomy scan:  20 weeks, with normal findings and an anterior placenta.   Additional [place] evaluations:  32 weeks--EFW 4+13, 80%ile, normal fluid, cervix closed.   Significant prenatal events: Started on Buspar during pregnancy due to previous use and good results for anxiety.  Seen at 19 weeks after fall/hitting head, negative head CT except for chronic sinusitis.  ASCUS with HRHPV on pap 05/2015--colpo during pregnancy, plan for repeat Pap pp.  Normal thyroid testing during pregnancy.  Desired repeat C/S, scheduled 11/29/15. Last evaluation:  Office on 10/24/15, MAU on 7/20 for decreased FM, 7/27 for N/V.  OB History    Gravida Para Term Preterm AB Living   3 1 1   1 1    SAB TAB Ectopic Multiple Live Births   1       1    2004--Primary LTCS, 40 weeks, FTP, arrest at 8 cm, female, 8+6, Dr. Leo Grosser 2016--SAB at 6  weeks  Past Medical History:  Diagnosis Date  . Abnormal Pap smear of cervix 06/2009   pos HR HPV with colpo--"precancerous cells" found per pt. 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 Hypothyroidism   . Thyroid disease    middle school, returned to normal after childbirth   Past Surgical History:  Procedure Laterality Date  . CESAREAN SECTION  10/20/02  . COLPOSCOPY  06/2009   "precancerous cells" on biopsy  . INTRAUTERINE DEVICE INSERTION  06/14/09   Mirena  . TONSILLECTOMY AND ADENOIDECTOMY  1992   Family History: family history includes Cancer in her maternal grandmother and paternal grandfather; Dementia in her paternal grandmother; Emphysema in her maternal grandmother; Stroke (age of onset: 1) in her father; Stroke (age of onset: 71) in her maternal grandfather.   Social History:  reports that she has been smoking Cigarettes.  She has been smoking about 0.50 packs per day. She has never used smokeless tobacco. She reports that she does not drink alcohol or use drugs.   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  She is Caucasian.  Prenatal Transfer Tool  Maternal Diabetes: No Genetic Screening: Normal 1st trimester screen and AFP Maternal Ultrasounds/Referrals: Normal Fetal Ultrasounds or other Referrals:  None Maternal Substance Abuse:  No Significant Maternal Medications:  Meds include: Other: buspar Significant Maternal Lab Results: None--GBS not done yet  TDAP 09/11/15 Flu 05/09/15  ROS:  N/V, good fetal movement, fatigue, dehydration  No Known Allergies     Last menstrual period 02/28/2015, unknown if currently breastfeeding.  Chest clear Heart RRR without murmur Abd gravid, NT, FH 36 cm Pelvic: Deferred Ext: WNL  FHR: Category 1 UCs:  None  Prenatal labs: ABO, Rh:  A+ Antibody:  Neg Rubella:  Non-immune RPR:   NR HBsAg:   Neg HIV:   Neg GBS:  NA--not done yet Sickle cell/Hgb  electrophoresis:  NA Pap:  ASCUS with HRHPV 05/2015--colpo during pregnancy, plan repeat PP GC: Negative 08/16/15 Chlamydia:  Negative 08/16/15 Genetic screenings:  Normal 1st trimester screen and AFP Glucola:  WNL Other:   Hgb 14 at NOB, 11.1 at 28 weeks Thyroid panel WNL during pregnancy   Assessment/Plan: IUP at 35 2/7 weeks Persistent N/V Previous C/S, scheduled for repeat 11/29/15 Anxiety Hypothyroidism--no meds Rubella non-immune  Plan: Admit to [place] per consult with Dr. Alesia Richards Routine [facility] Antenatal orders IV hydration Zofran IV Pepcid IV NPO at present, except for sips and chips CBC, CMP, UA Hydrate through night, then re-evaluate in am. Continue Buspar BID NST q shift.  Donnel Saxon CNM, [place] 11/01/2015, 11:18 PM

## 2015-11-02 ENCOUNTER — Encounter (HOSPITAL_COMMUNITY): Payer: Self-pay

## 2015-11-02 DIAGNOSIS — O99333 Smoking (tobacco) complicating pregnancy, third trimester: Secondary | ICD-10-CM | POA: Diagnosis not present

## 2015-11-02 DIAGNOSIS — O99343 Other mental disorders complicating pregnancy, third trimester: Secondary | ICD-10-CM | POA: Diagnosis not present

## 2015-11-02 DIAGNOSIS — O99283 Endocrine, nutritional and metabolic diseases complicating pregnancy, third trimester: Secondary | ICD-10-CM | POA: Diagnosis not present

## 2015-11-02 DIAGNOSIS — O212 Late vomiting of pregnancy: Secondary | ICD-10-CM | POA: Diagnosis not present

## 2015-11-02 DIAGNOSIS — F17210 Nicotine dependence, cigarettes, uncomplicated: Secondary | ICD-10-CM | POA: Diagnosis not present

## 2015-11-02 DIAGNOSIS — Z3A35 35 weeks gestation of pregnancy: Secondary | ICD-10-CM | POA: Diagnosis not present

## 2015-11-02 DIAGNOSIS — K210 Gastro-esophageal reflux disease with esophagitis: Secondary | ICD-10-CM | POA: Diagnosis not present

## 2015-11-02 DIAGNOSIS — O99613 Diseases of the digestive system complicating pregnancy, third trimester: Secondary | ICD-10-CM | POA: Diagnosis not present

## 2015-11-02 DIAGNOSIS — O34211 Maternal care for low transverse scar from previous cesarean delivery: Secondary | ICD-10-CM | POA: Diagnosis not present

## 2015-11-02 DIAGNOSIS — R112 Nausea with vomiting, unspecified: Secondary | ICD-10-CM | POA: Diagnosis not present

## 2015-11-02 DIAGNOSIS — E039 Hypothyroidism, unspecified: Secondary | ICD-10-CM | POA: Diagnosis not present

## 2015-11-02 DIAGNOSIS — K219 Gastro-esophageal reflux disease without esophagitis: Secondary | ICD-10-CM | POA: Diagnosis not present

## 2015-11-02 LAB — TYPE AND SCREEN
ABO/RH(D): A POS
ANTIBODY SCREEN: NEGATIVE

## 2015-11-02 LAB — CBC
HEMATOCRIT: 34.9 % — AB (ref 36.0–46.0)
HEMOGLOBIN: 11.9 g/dL — AB (ref 12.0–15.0)
MCH: 32.4 pg (ref 26.0–34.0)
MCHC: 34.1 g/dL (ref 30.0–36.0)
MCV: 95.1 fL (ref 78.0–100.0)
Platelets: 217 10*3/uL (ref 150–400)
RBC: 3.67 MIL/uL — AB (ref 3.87–5.11)
RDW: 13.9 % (ref 11.5–15.5)
WBC: 10.9 10*3/uL — ABNORMAL HIGH (ref 4.0–10.5)

## 2015-11-02 LAB — URINALYSIS, ROUTINE W REFLEX MICROSCOPIC
BILIRUBIN URINE: NEGATIVE
Glucose, UA: NEGATIVE mg/dL
Hgb urine dipstick: NEGATIVE
KETONES UR: NEGATIVE mg/dL
Leukocytes, UA: NEGATIVE
NITRITE: NEGATIVE
PH: 6 (ref 5.0–8.0)
PROTEIN: NEGATIVE mg/dL
Specific Gravity, Urine: <1.005 — ABNORMAL LOW (ref 1.005–1.030)

## 2015-11-02 LAB — COMPREHENSIVE METABOLIC PANEL
ALK PHOS: 96 U/L (ref 38–126)
ALT: 21 U/L (ref 14–54)
ANION GAP: 8 (ref 5–15)
AST: 22 U/L (ref 15–41)
Albumin: 2.8 g/dL — ABNORMAL LOW (ref 3.5–5.0)
BILIRUBIN TOTAL: 0.6 mg/dL (ref 0.3–1.2)
BUN: 5 mg/dL — ABNORMAL LOW (ref 6–20)
CALCIUM: 8.5 mg/dL — AB (ref 8.9–10.3)
CO2: 23 mmol/L (ref 22–32)
CREATININE: 0.46 mg/dL (ref 0.44–1.00)
Chloride: 103 mmol/L (ref 101–111)
Glucose, Bld: 93 mg/dL (ref 65–99)
Potassium: 3.4 mmol/L — ABNORMAL LOW (ref 3.5–5.1)
SODIUM: 134 mmol/L — AB (ref 135–145)
TOTAL PROTEIN: 6.1 g/dL — AB (ref 6.5–8.1)

## 2015-11-02 MED ORDER — BUSPIRONE HCL 15 MG PO TABS
7.5000 mg | ORAL_TABLET | Freq: Two times a day (BID) | ORAL | Status: DC
  Filled 2015-11-02 (×3): qty 1

## 2015-11-02 MED ORDER — NICOTINE 21 MG/24HR TD PT24
21.0000 mg | MEDICATED_PATCH | Freq: Every day | TRANSDERMAL | Status: DC
  Administered 2015-11-02 – 2015-11-03 (×2): 21 mg via TRANSDERMAL
  Filled 2015-11-02 (×2): qty 1

## 2015-11-02 MED ORDER — PROMETHAZINE HCL 25 MG RE SUPP: 12.5000 mg | Freq: Four times a day (QID) | RECTAL | Status: DC | PRN

## 2015-11-02 MED ORDER — ONDANSETRON 4 MG PO TBDP
4.0000 mg | ORAL_TABLET | Freq: Three times a day (TID) | ORAL | Status: DC | PRN
  Filled 2015-11-02: qty 1

## 2015-11-02 MED ORDER — FAMOTIDINE 20 MG PO TABS
20.0000 mg | ORAL_TABLET | Freq: Two times a day (BID) | ORAL | Status: DC
  Administered 2015-11-02 – 2015-11-03 (×3): 20 mg via ORAL
  Filled 2015-11-02 (×3): qty 1

## 2015-11-02 NOTE — Progress Notes (Signed)
Pt without complaints.  No leakage of fluid or VB.  Good FM.  She has not had any nausea.   BP (!) 103/57   Pulse 70   Temp 98.2 F (36.8 C) (Oral)   Resp 20   LMP 02/28/2015   FHTS category 1  Toco none  Pt in NAD CV RRR Lungs CTAB abd  Gravid soft and NT GU no vb EXt no calf tenderness Results for orders placed or performed during the hospital encounter of 11/01/15 (from the past 72 hour(s))  CBC on admission     Status: Abnormal   Collection Time: 11/01/15 11:50 PM  Result Value Ref Range   WBC 10.9 (H) 4.0 - 10.5 K/uL   RBC 3.67 (L) 3.87 - 5.11 MIL/uL   Hemoglobin 11.9 (L) 12.0 - 15.0 g/dL   HCT 34.9 (L) 36.0 - 46.0 %   MCV 95.1 78.0 - 100.0 fL   MCH 32.4 26.0 - 34.0 pg   MCHC 34.1 30.0 - 36.0 g/dL   RDW 13.9 11.5 - 15.5 %   Platelets 217 150 - 400 K/uL  Comprehensive metabolic panel     Status: Abnormal   Collection Time: 11/01/15 11:50 PM  Result Value Ref Range   Sodium 134 (L) 135 - 145 mmol/L   Potassium 3.4 (L) 3.5 - 5.1 mmol/L   Chloride 103 101 - 111 mmol/L   CO2 23 22 - 32 mmol/L   Glucose, Bld 93 65 - 99 mg/dL   BUN 5 (L) 6 - 20 mg/dL   Creatinine, Ser 0.46 0.44 - 1.00 mg/dL   Calcium 8.5 (L) 8.9 - 10.3 mg/dL   Total Protein 6.1 (L) 6.5 - 8.1 g/dL   Albumin 2.8 (L) 3.5 - 5.0 g/dL   AST 22 15 - 41 U/L   ALT 21 14 - 54 U/L   Alkaline Phosphatase 96 38 - 126 U/L   Total Bilirubin 0.6 0.3 - 1.2 mg/dL   GFR calc non Af Amer >60 >60 mL/min   GFR calc Af Amer >60 >60 mL/min    Comment: (NOTE) The eGFR has been calculated using the CKD EPI equation. This calculation has not been validated in all clinical situations. eGFR's persistently <60 mL/min signify possible Chronic Kidney Disease.    Anion gap 8 5 - 15  Urinalysis, Routine w reflex microscopic (not at [facility])     Status: Abnormal   Collection Time: 11/01/15 11:56 PM  Result Value Ref Range   Color, Urine YELLOW YELLOW   APPearance CLEAR CLEAR   Specific Gravity, Urine <1.005 (L) 1.005 - 1.030    pH 6.0 5.0 - 8.0   Glucose, UA NEGATIVE NEGATIVE mg/dL   Hgb urine dipstick NEGATIVE NEGATIVE   Bilirubin Urine NEGATIVE NEGATIVE   Ketones, ur NEGATIVE NEGATIVE mg/dL   Protein, ur NEGATIVE NEGATIVE mg/dL   Nitrite NEGATIVE NEGATIVE   Leukocytes, UA NEGATIVE NEGATIVE    Comment: MICROSCOPIC NOT DONE ON URINES WITH NEGATIVE PROTEIN, BLOOD, LEUKOCYTES, NITRITE, OR GLUCOSE <1000 mg/dL.  Type and screen [place]     Status: None   Collection Time: 11/02/15 12:36 AM  Result Value Ref Range   ABO/RH(D) A POS    Antibody Screen NEG    Sample Expiration 11/05/2015     Assessment and Plan [redacted]w[redacted]d N/V of pregnancy Change meds to PO and consider discharge tonight or tomorrow

## 2015-11-02 NOTE — Progress Notes (Signed)
  Subjective: Sleeping at present--denied pain or N/V prior to sleep.  Objective: BP (!) 100/53 (BP Location: Right Arm)   Pulse 66   Temp 97.9 F (36.6 C) (Oral)   Resp 14   LMP 02/28/2015  No intake/output data recorded. No intake/output data recorded.  FHT: Category 1 on TID monitoring UC:   Very occasional per patient   Results for orders placed or performed during the hospital encounter of 11/01/15 (from the past 24 hour(s))  CBC on admission     Status: Abnormal   Collection Time: 11/01/15 11:50 PM  Result Value Ref Range   WBC 10.9 (H) 4.0 - 10.5 K/uL   RBC 3.67 (L) 3.87 - 5.11 MIL/uL   Hemoglobin 11.9 (L) 12.0 - 15.0 g/dL   HCT 34.9 (L) 36.0 - 46.0 %   MCV 95.1 78.0 - 100.0 fL   MCH 32.4 26.0 - 34.0 pg   MCHC 34.1 30.0 - 36.0 g/dL   RDW 13.9 11.5 - 15.5 %   Platelets 217 150 - 400 K/uL  Comprehensive metabolic panel     Status: Abnormal   Collection Time: 11/01/15 11:50 PM  Result Value Ref Range   Sodium 134 (L) 135 - 145 mmol/L   Potassium 3.4 (L) 3.5 - 5.1 mmol/L   Chloride 103 101 - 111 mmol/L   CO2 23 22 - 32 mmol/L   Glucose, Bld 93 65 - 99 mg/dL   BUN 5 (L) 6 - 20 mg/dL   Creatinine, Ser 0.46 0.44 - 1.00 mg/dL   Calcium 8.5 (L) 8.9 - 10.3 mg/dL   Total Protein 6.1 (L) 6.5 - 8.1 g/dL   Albumin 2.8 (L) 3.5 - 5.0 g/dL   AST 22 15 - 41 U/L   ALT 21 14 - 54 U/L   Alkaline Phosphatase 96 38 - 126 U/L   Total Bilirubin 0.6 0.3 - 1.2 mg/dL   GFR calc non Af Amer >60 >60 mL/min   GFR calc Af Amer >60 >60 mL/min   Anion gap 8 5 - 15  Urinalysis, Routine w reflex microscopic (not at [facility])     Status: Abnormal   Collection Time: 11/01/15 11:56 PM  Result Value Ref Range   Color, Urine YELLOW YELLOW   APPearance CLEAR CLEAR   Specific Gravity, Urine <1.005 (L) 1.005 - 1.030   pH 6.0 5.0 - 8.0   Glucose, UA NEGATIVE NEGATIVE mg/dL   Hgb urine dipstick NEGATIVE NEGATIVE   Bilirubin Urine NEGATIVE NEGATIVE   Ketones, ur NEGATIVE NEGATIVE mg/dL   Protein, ur  NEGATIVE NEGATIVE mg/dL   Nitrite NEGATIVE NEGATIVE   Leukocytes, UA NEGATIVE NEGATIVE  Type and screen [place]     Status: None   Collection Time: 11/02/15 12:36 AM  Result Value Ref Range   ABO/RH(D) A POS    Antibody Screen NEG    Sample Expiration 11/05/2015     Assessment:  IUP at 35 2/7 weeks N/V, improving  Plan: Try to advance diet this am, evaluate for d/c later today.  Sara Shannon CNM 11/02/2015, 6:46 AM

## 2015-11-03 ENCOUNTER — Encounter (HOSPITAL_COMMUNITY): Payer: Self-pay | Admitting: General Practice

## 2015-11-03 DIAGNOSIS — E039 Hypothyroidism, unspecified: Secondary | ICD-10-CM | POA: Diagnosis not present

## 2015-11-03 DIAGNOSIS — F17210 Nicotine dependence, cigarettes, uncomplicated: Secondary | ICD-10-CM | POA: Diagnosis not present

## 2015-11-03 DIAGNOSIS — O99283 Endocrine, nutritional and metabolic diseases complicating pregnancy, third trimester: Secondary | ICD-10-CM | POA: Diagnosis not present

## 2015-11-03 DIAGNOSIS — O99333 Smoking (tobacco) complicating pregnancy, third trimester: Secondary | ICD-10-CM | POA: Diagnosis not present

## 2015-11-03 DIAGNOSIS — Z3A35 35 weeks gestation of pregnancy: Secondary | ICD-10-CM | POA: Diagnosis not present

## 2015-11-03 DIAGNOSIS — K219 Gastro-esophageal reflux disease without esophagitis: Secondary | ICD-10-CM | POA: Diagnosis not present

## 2015-11-03 DIAGNOSIS — O34211 Maternal care for low transverse scar from previous cesarean delivery: Secondary | ICD-10-CM | POA: Diagnosis not present

## 2015-11-03 DIAGNOSIS — O99343 Other mental disorders complicating pregnancy, third trimester: Secondary | ICD-10-CM | POA: Diagnosis not present

## 2015-11-03 DIAGNOSIS — O212 Late vomiting of pregnancy: Secondary | ICD-10-CM | POA: Diagnosis not present

## 2015-11-03 DIAGNOSIS — O99613 Diseases of the digestive system complicating pregnancy, third trimester: Secondary | ICD-10-CM | POA: Diagnosis not present

## 2015-11-03 DIAGNOSIS — R112 Nausea with vomiting, unspecified: Secondary | ICD-10-CM | POA: Diagnosis not present

## 2015-11-03 DIAGNOSIS — K210 Gastro-esophageal reflux disease with esophagitis: Secondary | ICD-10-CM | POA: Diagnosis not present

## 2015-11-03 MED ORDER — PRENATAL MULTIVITAMIN CH: 1.0000 | ORAL_TABLET | Freq: Every day | ORAL | 2 refills | Status: DC

## 2015-11-03 MED ORDER — FAMOTIDINE 20 MG PO TABS: 20.0000 mg | ORAL_TABLET | Freq: Two times a day (BID) | ORAL | 0 refills | Status: DC

## 2015-11-03 MED ORDER — ZOLPIDEM TARTRATE 5 MG PO TABS: 5.0000 mg | ORAL_TABLET | Freq: Every evening | ORAL | 0 refills | Status: DC | PRN

## 2015-11-03 MED ORDER — ONDANSETRON 4 MG PO TBDP: 4.0000 mg | ORAL_TABLET | Freq: Three times a day (TID) | ORAL | 0 refills | Status: DC | PRN

## 2015-11-03 NOTE — Discharge Summary (Addendum)
Sara Shannon, Menth DOB: 03-06-1981 MRN: [id number]  Obstetric Discharge Summary Reason for Admission: 1.  Nausea and vomiting of pregnancy.   2.  Acid reflux symptoms. 3.  35 week 1 day EGA IUP. 4.  Anxiety.  5. Insomnia.    Hospital course:    Patient's symptoms of nausea and vomiting and acid  improved with  Zofran and pepcid use.  She was able to tolerate regular diet before discharge and was deemed stable for discharge.  She desired ambien to help with her insomnia and was given a few tablets.        Physical Exam:  General: alert, cooperative and no distress  CVS: s1, s2, RRR Pulm: Clear to auscultation bilaterally.  Uterine Fundus: Gravid, soft DVT Evaluation: No evidence of DVT seen on physical exam. No significant calf/ankle edema.  Discharge Diagnoses:  1.  Nausea and vomiting of pregnancy.   2.  Acid reflux symptoms. 3.  35 week 3 day EGA IUP. 4. Anxiety.  5. Insomnia.   Significant labs:  CBC    Component Value Date/Time   WBC 10.9 (H) 11/01/2015 2350   RBC 3.67 (L) 11/01/2015 2350   HGB 11.9 (L) 11/01/2015 2350   HGB 14.6 05/17/2014 0938   HCT 34.9 (L) 11/01/2015 2350   PLT 217 11/01/2015 2350   MCV 95.1 11/01/2015 2350   MCH 32.4 11/01/2015 2350   MCHC 34.1 11/01/2015 2350   RDW 13.9 11/01/2015 2350   LYMPHSABS 2.3 10/07/2013 1941   MONOABS 0.6 10/07/2013 1941   EOSABS 0.2 10/07/2013 1941   BASOSABS 0.0 10/07/2013 1941   CMP     Component Value Date/Time   NA 134 (L) 11/01/2015 2350   K 3.4 (L) 11/01/2015 2350   CL 103 11/01/2015 2350   CO2 23 11/01/2015 2350   GLUCOSE 93 11/01/2015 2350   BUN 5 (L) 11/01/2015 2350   CREATININE 0.46 11/01/2015 2350   CREATININE 0.83 05/17/2014 1302   CALCIUM 8.5 (L) 11/01/2015 2350   PROT 6.1 (L) 11/01/2015 2350   ALBUMIN 2.8 (L) 11/01/2015 2350   AST 22 11/01/2015 2350   ALT 21 11/01/2015 2350   ALKPHOS 96 11/01/2015 2350   BILITOT 0.6 11/01/2015 2350   GFRNONAA >60 11/01/2015 2350   GFRAA >60 11/01/2015  2350     Discharge Information: Date: 11/03/2015 Activity: unrestricted Diet: routine Medications: Zofran, ambien, buspirone, prenatal vitamin tablets, Pepcid.  Condition: stable Instructions: refer to practice specific booklet and Morning sickness in pregnancy Discharge to: home   [facility] Follow up in 1 day(s).   Specialty:  Obstetrics and Gynecology Why:  0930 Contact information: [address] [place] [phone]           Alinda Dooms 11/03/2015, 11:42 AM

## 2015-11-04 DIAGNOSIS — Z36 Encounter for antenatal screening of mother: Secondary | ICD-10-CM | POA: Diagnosis not present

## 2015-11-21 ENCOUNTER — Encounter (HOSPITAL_COMMUNITY): Payer: Self-pay

## 2015-11-28 ENCOUNTER — Encounter (HOSPITAL_COMMUNITY)
Admission: RE | Admit: 2015-11-28 | Discharge: 2015-11-28 | Disposition: A | Discharge status: Home / Self Care | Payer: 59 | Source: Ambulatory Visit | Attending: Obstetrics and Gynecology | Admitting: Obstetrics and Gynecology

## 2015-11-28 DIAGNOSIS — K219 Gastro-esophageal reflux disease without esophagitis: Secondary | ICD-10-CM | POA: Diagnosis not present

## 2015-11-28 DIAGNOSIS — E559 Vitamin D deficiency, unspecified: Secondary | ICD-10-CM | POA: Diagnosis not present

## 2015-11-28 DIAGNOSIS — O99344 Other mental disorders complicating childbirth: Secondary | ICD-10-CM | POA: Diagnosis not present

## 2015-11-28 DIAGNOSIS — Z3A39 39 weeks gestation of pregnancy: Secondary | ICD-10-CM | POA: Diagnosis not present

## 2015-11-28 DIAGNOSIS — O9962 Diseases of the digestive system complicating childbirth: Secondary | ICD-10-CM | POA: Diagnosis not present

## 2015-11-28 DIAGNOSIS — F419 Anxiety disorder, unspecified: Secondary | ICD-10-CM | POA: Diagnosis not present

## 2015-11-28 DIAGNOSIS — F17210 Nicotine dependence, cigarettes, uncomplicated: Secondary | ICD-10-CM | POA: Diagnosis not present

## 2015-11-28 DIAGNOSIS — O34211 Maternal care for low transverse scar from previous cesarean delivery: Secondary | ICD-10-CM | POA: Diagnosis not present

## 2015-11-28 DIAGNOSIS — O99334 Smoking (tobacco) complicating childbirth: Secondary | ICD-10-CM | POA: Diagnosis not present

## 2015-11-28 LAB — TYPE AND SCREEN
ABO/RH(D): A POS
ANTIBODY SCREEN: NEGATIVE

## 2015-11-28 LAB — CBC
HCT: 37.4 % (ref 36.0–46.0)
Hemoglobin: 12.9 g/dL (ref 12.0–15.0)
MCH: 32.3 pg (ref 26.0–34.0)
MCHC: 34.5 g/dL (ref 30.0–36.0)
MCV: 93.7 fL (ref 78.0–100.0)
Platelets: 213 10*3/uL (ref 150–400)
RBC: 3.99 MIL/uL (ref 3.87–5.11)
RDW: 14.1 % (ref 11.5–15.5)
WBC: 9.8 10*3/uL (ref 4.0–10.5)

## 2015-11-28 NOTE — Patient Instructions (Signed)
[place]  11/28/2015   Your procedure is scheduled on:  11/29/2015  Enter through the Main Entrance of [facility] up the phone at the desk and dial [phone].   Call this number if you have problems the morning of surgery: [phone]   Remember:   Do not eat food:After Midnight.  Do not drink clear liquids: After Midnight.  Take these medicines the morning of surgery with A SIP OF WATER: pepcid   Do not wear jewelry, make-up or nail polish.  Do not wear lotions, powders, or perfumes. Do not wear deodorant.  Do not shave 48 hours prior to surgery.  Do not bring valuables to the hospital.  [facility] is not   responsible for any belongings or valuables brought to the hospital.  Contacts, dentures or bridgework may not be worn into surgery.  Leave suitcase in the car. After surgery it may be brought to your room.  For patients admitted to the hospital, checkout time is 11:00 AM the day of              discharge.   Patients discharged the day of surgery will not be allowed to drive             home.  Name and phone number of your driver: na  Special Instructions:   N/A   Please read over the following fact sheets that you were given:   Surgical Site Infection Prevention

## 2015-11-28 NOTE — H&P (Signed)
Sara Shannon is a 35 y.o. female, G3P1011 at 60 1/7 weeks, presenting on 11/29/15 for scheduled repeat C/S.  She denies bleeding or leaking, reports +FM.    She has been on Buspar during pregnancy for anxiety, had ASCUS with positive HR HPV (repeat pap planned pp), rubella non-immune, stable thyroid testing during pregnancy, smoker (used nicotine patches briefly during pregnancy).  Patient Active Problem List   Diagnosis Date Noted  . Hx of cesarean section 08/28/2015  . History of miscarriage, currently pregnant 08/28/2015  . Anxiety disorder 08/28/2015  . Hypothyroidism 08/28/2015  . Vitamin D deficiency 08/28/2015  . Rubella non-immune status, antepartum 08/28/2015  . Gastroenteritis 08/28/2015  . GERD (gastroesophageal reflux disease) 08/28/2015  . Abnormal Pap smear of cervix 06/04/2009    History of present pregnancy: Patient entered care at 10 weeks.   EDC of 12/05/15 was established by LMP and in agreement with [place] at 7 1/7 weeks.   Anatomy scan:  20 weeks, with normal findings, EFW 86%ile, normal fluid, and an anterior placenta.   Additional [place] evaluations:   32 weeks:  EFW 4+13, 80%ile, AFI 17.06, 65%ile, vtx. Significant prenatal events:  On Buspar during pregnancy for anxiety.  Use nicotine patches for assistance in smoking cessation.  Several MAU visits during pregnancy, with overnight admission for IV hydration due to dehydration.  Going through separation from husband during pregnancy.  Desired repeat C/S.  Pap during pregnancy showed ASCUS with HR HPV--had colpo at 30 weeks, with plan for repeat pap PP.  Last evaluation:  11/20/15--cervix 1 cm, 20%, vtx, -3, BP 114/72, weight 191.  OB History    Gravida Para Term Preterm AB Living   3 1 1   1 1    SAB TAB Ectopic Multiple Live Births   1       1    2004--Primary LTCS due to FTP and NRFHR, 39 4/7 weeks, female, 8+4, delivered by Dr. Leo Grosser [address]--SAB at 6 weeks  Past Medical History:  Diagnosis Date  . Abnormal  Pap smear of cervix 06/2009   pos HR HPV with colpo--"precancerous cells" found per pt. 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 Hypothyroidism   . Thyroid disease    middle school, returned to normal after childbirth   Past Surgical History:  Procedure Laterality Date  . CESAREAN SECTION  10/20/02  . COLPOSCOPY  06/2009   "precancerous cells" on biopsy  . INTRAUTERINE DEVICE INSERTION  06/14/09   Mirena  . TONSILLECTOMY AND ADENOIDECTOMY  1992   Family History: family history includes Cancer in her paternal grandfather; Dementia in her paternal grandmother; Emphysema in her maternal grandmother; Stroke (age of onset: 45) in her father; Stroke (age of onset: 46) in her maternal grandfather.   Social History:  reports that she has been smoking Cigarettes.  She has been smoking about 0.50 packs per day. She has never used smokeless tobacco. She reports that she does not drink alcohol or use drugs.  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   Prenatal Transfer Tool  Maternal Diabetes: No Genetic Screening: Normal 1st trimester screen and AFP Maternal Ultrasounds/Referrals: Normal Fetal Ultrasounds or other Referrals:  None Maternal Substance Abuse:  Yes:  Type: Smoker Significant Maternal Medications:  Meds include: Other: Buspar Significant Maternal Lab Results: Lab values include: Group B Strep negative  TDAP 09/11/15 Flu 2016 at [facility]  ROS:  Occasional UCs, +FM  No Known Allergies     Last menstrual period 02/28/2015, unknown if currently breastfeeding.  Chest clear Heart RRR without murmur Abd gravid, NT, FH 38 cm Pelvic: 1 cm, thick, vtx at last visit Ext: WNL  FHR: 152 at last visit UCs:  Occasional, mild  Prenatal labs: ABO, Rh: --/--/A POS (08/24 1100) Antibody: NEG (08/24 1100) Rubella:  Non-immune RPR:  NR  HBsAg: Negative (02/13 0000)  HIV:    NR GBS:  Negative 11/04/15 Sickle cell/Hgb electrophoresis:  NA Pap:  05/16/15--ASCUS with HR HPV GC:  Negative 08/29/15 Chlamydia:  Negative 08/29/15 Genetic screenings:  Normal 1st trimester screen and AFP Glucola:  WNL Other:   Hgb 9.9 at NOB, 12.9 at on 11/28/15. Vit D 24 03/2015 Thyroid testing WNL during pregnancy  Assessment/Plan: IUP at 39 1/7 weeks Previous C/S, desires repeat Rubella non-immune Anxiety dx--on Buspar Hypothyroidism--no current meds GBS negative Smoker Abnormal pap--plan repeat pp  Plan: Admit to [facility] per consult with Dr. Mancel Bale for scheduled repeat C/S. Routine [facility] pre-op orders Continue Buspar for anxiety Rubella vaccine pp Consider SW consult for relationship issues prn  Donnel Saxon CNM, [place] 11/28/2015, 11:34 PM

## 2015-11-29 ENCOUNTER — Encounter (HOSPITAL_COMMUNITY): Payer: Self-pay

## 2015-11-29 ENCOUNTER — Encounter (HOSPITAL_COMMUNITY)
Admission: RE | Disposition: A | Discharge status: Home / Self Care | Payer: Self-pay | Source: Ambulatory Visit | Attending: Obstetrics and Gynecology

## 2015-11-29 ENCOUNTER — Inpatient Hospital Stay (HOSPITAL_COMMUNITY): Payer: 59 | Admitting: Anesthesiology

## 2015-11-29 ENCOUNTER — Inpatient Hospital Stay (HOSPITAL_COMMUNITY)
Admission: RE | Admit: 2015-11-29 | Discharge: 2015-12-01 | DRG: 766 | Disposition: A | Discharge status: Home / Self Care | Payer: 59 | Source: Ambulatory Visit | Attending: Obstetrics and Gynecology | Admitting: Obstetrics and Gynecology

## 2015-11-29 DIAGNOSIS — F17210 Nicotine dependence, cigarettes, uncomplicated: Secondary | ICD-10-CM | POA: Diagnosis present

## 2015-11-29 DIAGNOSIS — K219 Gastro-esophageal reflux disease without esophagitis: Secondary | ICD-10-CM | POA: Diagnosis present

## 2015-11-29 DIAGNOSIS — O99334 Smoking (tobacco) complicating childbirth: Secondary | ICD-10-CM | POA: Diagnosis present

## 2015-11-29 DIAGNOSIS — Z3A39 39 weeks gestation of pregnancy: Secondary | ICD-10-CM

## 2015-11-29 DIAGNOSIS — O9962 Diseases of the digestive system complicating childbirth: Secondary | ICD-10-CM | POA: Diagnosis present

## 2015-11-29 DIAGNOSIS — E559 Vitamin D deficiency, unspecified: Secondary | ICD-10-CM | POA: Diagnosis present

## 2015-11-29 DIAGNOSIS — O99344 Other mental disorders complicating childbirth: Secondary | ICD-10-CM | POA: Diagnosis present

## 2015-11-29 DIAGNOSIS — Z98891 History of uterine scar from previous surgery: Secondary | ICD-10-CM

## 2015-11-29 DIAGNOSIS — O34211 Maternal care for low transverse scar from previous cesarean delivery: Secondary | ICD-10-CM | POA: Diagnosis not present

## 2015-11-29 DIAGNOSIS — F419 Anxiety disorder, unspecified: Secondary | ICD-10-CM | POA: Diagnosis present

## 2015-11-29 LAB — RPR: RPR Ser Ql: NONREACTIVE

## 2015-11-29 SURGERY — Surgical Case
Anesthesia: Spinal

## 2015-11-29 MED ORDER — WITCH HAZEL-GLYCERIN EX PADS: 1.0000 "application " | MEDICATED_PAD | CUTANEOUS | Status: DC | PRN

## 2015-11-29 MED ORDER — OXYTOCIN 10 UNIT/ML IJ SOLN
INTRAMUSCULAR | Status: AC
  Filled 2015-11-29: qty 4

## 2015-11-29 MED ORDER — SODIUM CHLORIDE 0.9% FLUSH: 3.0000 mL | INTRAVENOUS | Status: DC | PRN

## 2015-11-29 MED ORDER — FENTANYL CITRATE (PF) 100 MCG/2ML IJ SOLN: 25.0000 ug | INTRAMUSCULAR | Status: DC | PRN

## 2015-11-29 MED ORDER — KETOROLAC TROMETHAMINE 30 MG/ML IJ SOLN
30.0000 mg | Freq: Four times a day (QID) | INTRAMUSCULAR | Status: AC | PRN
  Administered 2015-11-29: 30 mg via INTRAVENOUS
  Filled 2015-11-29: qty 1

## 2015-11-29 MED ORDER — NALBUPHINE HCL 10 MG/ML IJ SOLN: 5.0000 mg | INTRAMUSCULAR | Status: DC | PRN

## 2015-11-29 MED ORDER — LACTATED RINGERS IV SOLN
Freq: Once | INTRAVENOUS | Status: AC
  Administered 2015-11-29: 08:00:00 via INTRAVENOUS

## 2015-11-29 MED ORDER — SODIUM CHLORIDE 0.9 % IR SOLN
Status: DC | PRN
  Administered 2015-11-29: 1

## 2015-11-29 MED ORDER — COCONUT OIL OIL: 1.0000 "application " | TOPICAL_OIL | Status: DC | PRN

## 2015-11-29 MED ORDER — PHENYLEPHRINE 8 MG IN D5W 100 ML (0.08MG/ML) PREMIX OPTIME
INJECTION | INTRAVENOUS | Status: DC | PRN
  Administered 2015-11-29: 60 ug/min via INTRAVENOUS

## 2015-11-29 MED ORDER — KETOROLAC TROMETHAMINE 30 MG/ML IJ SOLN: 30.0000 mg | Freq: Four times a day (QID) | INTRAMUSCULAR | Status: AC | PRN

## 2015-11-29 MED ORDER — NALOXONE HCL 0.4 MG/ML IJ SOLN: 0.4000 mg | INTRAMUSCULAR | Status: DC | PRN

## 2015-11-29 MED ORDER — DIBUCAINE 1 % RE OINT: 1.0000 "application " | TOPICAL_OINTMENT | RECTAL | Status: DC | PRN

## 2015-11-29 MED ORDER — DIPHENHYDRAMINE HCL 25 MG PO CAPS
25.0000 mg | ORAL_CAPSULE | ORAL | Status: DC | PRN
Start: 2015-11-29 — End: 2015-12-01
  Filled 2015-11-29: qty 1

## 2015-11-29 MED ORDER — SIMETHICONE 80 MG PO CHEW: 80.0000 mg | CHEWABLE_TABLET | ORAL | Status: DC | PRN

## 2015-11-29 MED ORDER — OXYTOCIN 40 UNITS IN LACTATED RINGERS INFUSION - SIMPLE MED: 2.5000 [IU]/h | INTRAVENOUS | Status: AC

## 2015-11-29 MED ORDER — TETANUS-DIPHTH-ACELL PERTUSSIS 5-2.5-18.5 LF-MCG/0.5 IM SUSP: 0.5000 mL | Freq: Once | INTRAMUSCULAR | Status: DC

## 2015-11-29 MED ORDER — SIMETHICONE 80 MG PO CHEW
80.0000 mg | CHEWABLE_TABLET | ORAL | Status: DC
  Administered 2015-11-30 (×2): 80 mg via ORAL
  Filled 2015-11-29 (×2): qty 1

## 2015-11-29 MED ORDER — SIMETHICONE 80 MG PO CHEW
80.0000 mg | CHEWABLE_TABLET | Freq: Three times a day (TID) | ORAL | Status: DC
  Administered 2015-11-29 – 2015-12-01 (×5): 80 mg via ORAL
  Filled 2015-11-29 (×5): qty 1

## 2015-11-29 MED ORDER — ONDANSETRON HCL 4 MG/2ML IJ SOLN
INTRAMUSCULAR | Status: AC
  Filled 2015-11-29: qty 2

## 2015-11-29 MED ORDER — LACTATED RINGERS IV BOLUS (SEPSIS)
500.0000 mL | Freq: Once | INTRAVENOUS | Status: AC
  Administered 2015-11-29: 500 mL via INTRAVENOUS

## 2015-11-29 MED ORDER — MEPERIDINE HCL 25 MG/ML IJ SOLN: 6.2500 mg | INTRAMUSCULAR | Status: DC | PRN

## 2015-11-29 MED ORDER — CEFAZOLIN SODIUM-DEXTROSE 2-4 GM/100ML-% IV SOLN
2.0000 g | INTRAVENOUS | Status: AC
  Administered 2015-11-29: 2 g via INTRAVENOUS

## 2015-11-29 MED ORDER — PHENYLEPHRINE HCL 10 MG/ML IJ SOLN
INTRAMUSCULAR | Status: DC | PRN
  Administered 2015-11-29: 160 ug via INTRAVENOUS
  Administered 2015-11-29 (×2): 120 ug via INTRAVENOUS

## 2015-11-29 MED ORDER — FENTANYL CITRATE (PF) 100 MCG/2ML IJ SOLN
INTRAMUSCULAR | Status: DC | PRN
  Administered 2015-11-29: 12.5 ug via INTRATHECAL

## 2015-11-29 MED ORDER — SCOPOLAMINE 1 MG/3DAYS TD PT72
1.0000 | MEDICATED_PATCH | Freq: Once | TRANSDERMAL | Status: DC
  Administered 2015-11-29: 1.5 mg via TRANSDERMAL

## 2015-11-29 MED ORDER — SENNOSIDES-DOCUSATE SODIUM 8.6-50 MG PO TABS
2.0000 | ORAL_TABLET | ORAL | Status: DC
  Administered 2015-11-30 (×2): 2 via ORAL
  Filled 2015-11-29 (×2): qty 2

## 2015-11-29 MED ORDER — METOCLOPRAMIDE HCL 5 MG/ML IJ SOLN: 10.0000 mg | Freq: Once | INTRAMUSCULAR | Status: DC | PRN

## 2015-11-29 MED ORDER — LACTATED RINGERS IV SOLN: INTRAVENOUS | Status: DC

## 2015-11-29 MED ORDER — LACTATED RINGERS IV SOLN
INTRAVENOUS | Status: DC
  Administered 2015-11-29 (×2): via INTRAVENOUS

## 2015-11-29 MED ORDER — NALOXONE HCL 2 MG/2ML IJ SOSY
1.0000 ug/kg/h | PREFILLED_SYRINGE | INTRAVENOUS | Status: DC | PRN
  Filled 2015-11-29: qty 2

## 2015-11-29 MED ORDER — NALBUPHINE HCL 10 MG/ML IJ SOLN
INTRAMUSCULAR | Status: AC
  Filled 2015-11-29: qty 1

## 2015-11-29 MED ORDER — BUPIVACAINE IN DEXTROSE 0.75-8.25 % IT SOLN
INTRATHECAL | Status: DC | PRN
Start: 2015-11-29 — End: 2015-11-29
  Administered 2015-11-29: 1.4 mL via INTRATHECAL

## 2015-11-29 MED ORDER — SCOPOLAMINE 1 MG/3DAYS TD PT72
MEDICATED_PATCH | TRANSDERMAL | Status: AC
  Administered 2015-11-29: 1.5 mg via TRANSDERMAL
  Filled 2015-11-29: qty 1

## 2015-11-29 MED ORDER — DIPHENHYDRAMINE HCL 25 MG PO CAPS: 25.0000 mg | ORAL_CAPSULE | Freq: Four times a day (QID) | ORAL | Status: DC | PRN

## 2015-11-29 MED ORDER — LACTATED RINGERS IV SOLN
INTRAVENOUS | Status: DC | PRN
  Administered 2015-11-29 (×3): via INTRAVENOUS

## 2015-11-29 MED ORDER — NALBUPHINE HCL 10 MG/ML IJ SOLN: 5.0000 mg | Freq: Once | INTRAMUSCULAR | Status: AC | PRN

## 2015-11-29 MED ORDER — DEXAMETHASONE SODIUM PHOSPHATE 4 MG/ML IJ SOLN
INTRAMUSCULAR | Status: DC | PRN
  Administered 2015-11-29: 4 mg via INTRAVENOUS

## 2015-11-29 MED ORDER — DEXAMETHASONE SODIUM PHOSPHATE 4 MG/ML IJ SOLN
INTRAMUSCULAR | Status: AC
  Filled 2015-11-29: qty 1

## 2015-11-29 MED ORDER — MENTHOL 3 MG MT LOZG: 1.0000 | LOZENGE | OROMUCOSAL | Status: DC | PRN

## 2015-11-29 MED ORDER — MORPHINE SULFATE (PF) 0.5 MG/ML IJ SOLN
INTRAMUSCULAR | Status: DC | PRN
  Administered 2015-11-29: .1 mg via INTRATHECAL

## 2015-11-29 MED ORDER — OXYTOCIN 10 UNIT/ML IJ SOLN
INTRAVENOUS | Status: DC | PRN
  Administered 2015-11-29: 40 [IU] via INTRAVENOUS

## 2015-11-29 MED ORDER — OXYCODONE HCL 5 MG PO TABS
5.0000 mg | ORAL_TABLET | ORAL | Status: DC | PRN
  Administered 2015-12-01: 5 mg via ORAL
  Filled 2015-11-29: qty 1

## 2015-11-29 MED ORDER — PRENATAL MULTIVITAMIN CH
1.0000 | ORAL_TABLET | Freq: Every day | ORAL | Status: DC
  Administered 2015-11-30: 1 via ORAL
  Filled 2015-11-29: qty 1

## 2015-11-29 MED ORDER — ZOLPIDEM TARTRATE 5 MG PO TABS: 5.0000 mg | ORAL_TABLET | Freq: Every evening | ORAL | Status: DC | PRN

## 2015-11-29 MED ORDER — FENTANYL CITRATE (PF) 100 MCG/2ML IJ SOLN
INTRAMUSCULAR | Status: AC
  Filled 2015-11-29: qty 2

## 2015-11-29 MED ORDER — DIPHENHYDRAMINE HCL 50 MG/ML IJ SOLN: 12.5000 mg | INTRAMUSCULAR | Status: DC | PRN

## 2015-11-29 MED ORDER — NALBUPHINE HCL 10 MG/ML IJ SOLN
5.0000 mg | Freq: Once | INTRAMUSCULAR | Status: AC | PRN
  Administered 2015-11-29: 5 mg via SUBCUTANEOUS

## 2015-11-29 MED ORDER — ONDANSETRON HCL 4 MG/2ML IJ SOLN: 4.0000 mg | Freq: Three times a day (TID) | INTRAMUSCULAR | Status: DC | PRN

## 2015-11-29 MED ORDER — LACTATED RINGERS IV SOLN
INTRAVENOUS | Status: DC
  Administered 2015-11-29: 10:00:00 via INTRAVENOUS

## 2015-11-29 MED ORDER — PHENYLEPHRINE 8 MG IN D5W 100 ML (0.08MG/ML) PREMIX OPTIME
INJECTION | INTRAVENOUS | Status: AC
  Filled 2015-11-29: qty 100

## 2015-11-29 MED ORDER — IBUPROFEN 600 MG PO TABS
600.0000 mg | ORAL_TABLET | Freq: Four times a day (QID) | ORAL | Status: DC
  Administered 2015-11-29 – 2015-12-01 (×8): 600 mg via ORAL
  Filled 2015-11-29 (×8): qty 1

## 2015-11-29 MED ORDER — MORPHINE SULFATE-NACL 0.5-0.9 MG/ML-% IV SOSY
PREFILLED_SYRINGE | INTRAVENOUS | Status: AC
  Filled 2015-11-29: qty 1

## 2015-11-29 MED ORDER — OXYCODONE HCL 5 MG PO TABS: 10.0000 mg | ORAL_TABLET | ORAL | Status: DC | PRN

## 2015-11-29 MED ORDER — ONDANSETRON HCL 4 MG/2ML IJ SOLN
INTRAMUSCULAR | Status: DC | PRN
Start: 2015-11-29 — End: 2015-11-29
  Administered 2015-11-29: 4 mg via INTRAVENOUS

## 2015-11-29 MED ORDER — ACETAMINOPHEN 325 MG PO TABS
650.0000 mg | ORAL_TABLET | ORAL | Status: DC | PRN
  Administered 2015-11-30: 650 mg via ORAL
  Filled 2015-11-29: qty 2

## 2015-11-29 SURGICAL SUPPLY — 38 items
APL SKNCLS STERI-STRIP NONHPOA (GAUZE/BANDAGES/DRESSINGS) ×1
BENZOIN TINCTURE PRP APPL 2/3 (GAUZE/BANDAGES/DRESSINGS) ×3 IMPLANT
CHLORAPREP W/TINT 26ML (MISCELLANEOUS) ×3 IMPLANT
CLAMP CORD UMBIL (MISCELLANEOUS) IMPLANT
CLOSURE STERI STRIP 1/2 X4 (GAUZE/BANDAGES/DRESSINGS) ×1 IMPLANT
CLOSURE WOUND 1/2 X4 (GAUZE/BANDAGES/DRESSINGS) ×1
CLOTH BEACON ORANGE TIMEOUT ST (SAFETY) ×3 IMPLANT
CONTAINER PREFILL 10% NBF 15ML (MISCELLANEOUS) IMPLANT
DRSG OPSITE POSTOP 4X10 (GAUZE/BANDAGES/DRESSINGS) ×3 IMPLANT
ELECT REM PT RETURN 9FT ADLT (ELECTROSURGICAL) ×3
ELECTRODE REM PT RTRN 9FT ADLT (ELECTROSURGICAL) ×1 IMPLANT
EXTRACTOR VACUUM M CUP 4 TUBE (SUCTIONS) IMPLANT
EXTRACTOR VACUUM M CUP 4' TUBE (SUCTIONS)
GLOVE BIO SURGEON STRL SZ7.5 (GLOVE) ×3 IMPLANT
GLOVE BIOGEL PI IND STRL 7.0 (GLOVE) ×1 IMPLANT
GLOVE BIOGEL PI IND STRL 7.5 (GLOVE) ×1 IMPLANT
GLOVE BIOGEL PI INDICATOR 7.0 (GLOVE) ×2
GLOVE BIOGEL PI INDICATOR 7.5 (GLOVE) ×2
GOWN STRL REUS W/TWL LRG LVL3 (GOWN DISPOSABLE) ×6 IMPLANT
KIT ABG SYR 3ML LUER SLIP (SYRINGE) IMPLANT
NDL HYPO 25X5/8 SAFETYGLIDE (NEEDLE) IMPLANT
NEEDLE HYPO 25X5/8 SAFETYGLIDE (NEEDLE) IMPLANT
NS IRRIG 1000ML POUR BTL (IV SOLUTION) ×3 IMPLANT
PACK C SECTION WH (CUSTOM PROCEDURE TRAY) ×3 IMPLANT
PAD OB MATERNITY 4.3X12.25 (PERSONAL CARE ITEMS) ×3 IMPLANT
PENCIL SMOKE EVAC W/HOLSTER (ELECTROSURGICAL) ×3 IMPLANT
RTRCTR C-SECT PINK 25CM LRG (MISCELLANEOUS) ×3 IMPLANT
STRIP CLOSURE SKIN 1/2X4 (GAUZE/BANDAGES/DRESSINGS) ×2 IMPLANT
SUT CHROMIC 2 0 CT 1 (SUTURE) ×3 IMPLANT
SUT MNCRL AB 3-0 PS2 27 (SUTURE) ×3 IMPLANT
SUT PLAIN 2 0 XLH (SUTURE) ×5 IMPLANT
SUT VIC AB 0 CT1 36 (SUTURE) ×3 IMPLANT
SUT VIC AB 0 CTX 36 (SUTURE) ×9
SUT VIC AB 0 CTX36XBRD ANBCTRL (SUTURE) ×3 IMPLANT
SUT VIC AB 2-0 SH 27 (SUTURE) ×6
SUT VIC AB 2-0 SH 27XBRD (SUTURE) ×2 IMPLANT
TOWEL OR 17X24 6PK STRL BLUE (TOWEL DISPOSABLE) ×3 IMPLANT
TRAY FOLEY CATH SILVER 14FR (SET/KITS/TRAYS/PACK) ×3 IMPLANT

## 2015-11-29 NOTE — Transfer of Care (Signed)
Immediate Anesthesia Transfer of Care Note  Patient: Sara Shannon  Procedure(s) Performed: Procedure(s) with comments: CESAREAN SECTION (N/A) - RNFA requested Nira Conn K  Patient Location: PACU  Anesthesia Type:Spinal  Level of Consciousness: awake, alert  and oriented  Airway & Oxygen Therapy: Patient Spontanous Breathing  Post-op Assessment: Report given to RN and Post -op Vital signs reviewed and stable  Post vital signs: Reviewed and stable  Last Vitals:  Vitals:   11/29/15 0806  BP: 116/80  Pulse: 64  Resp: 18  Temp: 36.7 C    Last Pain:  Vitals:   11/29/15 0806  TempSrc: Oral      Patients Stated Pain Goal: 4 ([id number])  Complications: No apparent anesthesia complications

## 2015-11-29 NOTE — Op Note (Addendum)
Cesarean Section Procedure Note  Indications: P1 at 31 1/7wks scheduled for repeat c-section  Pre-operative Diagnosis: 1.39 1/7wks 2.Prior Cesarean Section   Post-operative Diagnosis: 1.39 1/7wks 2.Prior Cesarean Section  Procedure: REPEAT CESAREAN SECTION  Surgeon: Everett Graff, MD    Assistants: Claretta Fraise, RNFA  Anesthesia: Regional  Anesthesiologist: Montez Hageman, MD   Procedure Details  The patient was taken to the operating room secondary to repeat c-section after the risks, benefits, complications, treatment options, and expected outcomes were discussed with the patient.  The patient concurred with the proposed plan, giving informed consent which was signed and witnessed. The patient was taken to Operating Room C-Section Suite, identified as [place] and the procedure verified as C-Section Delivery. A Time Out was held and the above information confirmed.  After induction of anesthesia by obtaining a spinal, the patient was prepped and draped in the usual sterile manner. A Pfannenstiel skin incision was made and carried down through the subcutaneous tissue to the underlying layer of fascia.  The fascia was incised bilaterally and extended transversely bilaterally with the Mayo scissors. Kocher clamps were placed on the inferior aspect of the fascial incision and the underlying rectus muscle was separated from the fascia. The same was done on the superior aspect of the fascial incision.  The peritoneum was identified, entered bluntly and extended manually.  An Alexis self-retaining retractor was placed.  The utero-vesical peritoneal reflection was incised transversely and the bladder flap was bluntly freed from the lower uterine segment. A low transverse uterine incision was made with the scalpel and extended bilaterally with the bandage scissors.  The infant was delivered in vertex position without difficulty.  After the umbilical cord was clamped and cut, the  infant was handed to the awaiting pediatricians.  Cord blood was obtained for evaluation.  The placenta was removed intact and appeared to be within normal limits. The uterus was cleared of all clots and debris. The uterine incision was closed with running interlocking sutures of 0 Vicryl and a second imbricating layer was performed as well.   Bilateral tubes and ovaries appeared to be within normal limits.  Good hemostasis was noted.  Copious irrigation was performed until clear.  The peritoneum was repaired with 2-0 chromic via a running suture.  The fascia was reapproximated with a running suture of 0 Vicryl. The subcutaneous tissue was reapproximated with 3 interrupted sutures of 2-0 plain.  The skin was reapproximated with a subcuticular suture of 3-0 monocryl.  Steristrips were applied.  Instrument, sponge, and needle counts were correct prior to abdominal closure and at the conclusion of the case.  The patient was awaiting transfer to the recovery room in good condition.  Findings: Live female infant with Apgars 7 at one minute and 9 at five minutes.  Normal appearing bilateral ovaries and fallopian tubes were noted.  Estimated Blood Loss:  700 ml         Drains: foley to gravity 50 cc         Total IV Fluids: 2700 ml         Specimens to Pathology: none         Complications:  None; patient tolerated the procedure well.         Disposition: PACU - hemodynamically stable.         Condition: stable  Attending Attestation: I performed the procedure.

## 2015-11-29 NOTE — Lactation Note (Signed)
This note was copied from a baby's chart. Lactation Consultation Note  Experienced BF mother who last BF 9 years ago.  Baby was hungry and cueing but could not stay attached related to tongue thrusting.  She did latch briefly a few times and swallows were heard. Encouraged mother to continue working with her. Hand expression taught with colostrum visible. Follow-up tomorrow. RN to continue assisting. Patient Name: Sara Shannon Today's Date: 11/29/2015 Reason for consult: Initial assessment   Maternal Data Has patient been taught Hand Expression?: Yes Does the patient have breastfeeding experience prior to this delivery?: Yes  Feeding Feeding Type: Breast Fed Length of feed: 5 min (some swallows)  LATCH Score/Interventions Latch: Repeated attempts needed to sustain latch, nipple held in mouth throughout feeding, stimulation needed to elicit sucking reflex.  Audible Swallowing: A few with stimulation  Type of Nipple: Everted at rest and after stimulation  Comfort (Breast/Nipple): Soft / non-tender     Hold (Positioning): Assistance needed to correctly position infant at breast and maintain latch.  LATCH Score: 7  Lactation Tools Discussed/Used     Consult Status Consult Status: Follow-up Date: 11/30/15 Follow-up type: In-patient    Sara Shannon 11/29/2015, 4:12 PM

## 2015-11-29 NOTE — Progress Notes (Signed)
Dr. Mancel Bale notified of patient's decreased urine output. New orders received. Will continue to monitor output closely.

## 2015-11-29 NOTE — Anesthesia Postprocedure Evaluation (Signed)
Anesthesia Post Note  Patient: Sara Shannon  Procedure(s) Performed: Procedure(s) (LRB): CESAREAN SECTION (N/A)  Patient location during evaluation: Mother Baby Anesthesia Type: Spinal Level of consciousness: awake Pain management: satisfactory to patient Vital Signs Assessment: post-procedure vital signs reviewed and stable Respiratory status: spontaneous breathing Cardiovascular status: stable Anesthetic complications: no     Last Vitals:  Vitals:   11/29/15 1115 11/29/15 1150  BP:  (!) 109/46  Pulse: 61 (!) 42  Resp: 15 18  Temp:  36.4 C    Last Pain:  Vitals:   11/29/15 1150  TempSrc: Oral  PainSc: 0-No pain   Pain Goal: Patients Stated Pain Goal: 4 (11/29/15 0806)               Casimer Lanius

## 2015-11-29 NOTE — Anesthesia Preprocedure Evaluation (Signed)
Anesthesia Evaluation  Patient identified by MRN, date of birth, ID band Patient awake    Reviewed: Allergy & Precautions, NPO status , Patient's Chart, lab work & pertinent test results  Airway Mallampati: I  TM Distance: >3 FB Neck ROM: Full    Dental no notable dental hx.    Pulmonary Current Smoker,    Pulmonary exam normal breath sounds clear to auscultation       Cardiovascular negative cardio ROS Normal cardiovascular exam Rhythm:Regular Rate:Normal     Neuro/Psych negative neurological ROS  negative psych ROS   GI/Hepatic Neg liver ROS, GERD  Medicated and Controlled,  Endo/Other  negative endocrine ROS  Renal/GU negative Renal ROS  negative genitourinary   Musculoskeletal negative musculoskeletal ROS (+)   Abdominal   Peds negative pediatric ROS (+)  Hematology negative hematology ROS (+)   Anesthesia Other Findings   Reproductive/Obstetrics (+) Pregnancy                             Anesthesia Physical Anesthesia Plan  ASA: II  Anesthesia Plan: Spinal   Post-op Pain Management:    Induction:   Airway Management Planned: Natural Airway  Additional Equipment:   Intra-op Plan:   Post-operative Plan:   Informed Consent: I have reviewed the patients History and Physical, chart, labs and discussed the procedure including the risks, benefits and alternatives for the proposed anesthesia with the patient or authorized representative who has indicated his/her understanding and acceptance.   Dental advisory given  Plan Discussed with: CRNA  Anesthesia Plan Comments:         Anesthesia Quick Evaluation

## 2015-11-29 NOTE — Anesthesia Procedure Notes (Signed)
Spinal  Patient location during procedure: OR Staffing Anesthesiologist: Montez Hageman Performed: anesthesiologist  Preanesthetic Checklist Completed: patient identified, site marked, surgical consent, pre-op evaluation, timeout performed, IV checked, risks and benefits discussed and monitors and equipment checked Spinal Block Patient position: sitting Prep: ChloraPrep Patient monitoring: heart rate, continuous pulse ox and blood pressure Approach: midline Location: L3-4 Injection technique: single-shot Needle Needle type: Sprotte  Needle gauge: 24 G Needle length: 9 cm Additional Notes Expiration date of kit checked and confirmed.  Attempt x1 by SRNA at L4-5.   Patient tolerated procedure well, without complications.

## 2015-11-29 NOTE — Interval H&P Note (Signed)
History and Physical Interval Note:  11/29/2015 9:21 AM  Sara Shannon  has presented today for surgery, with the diagnosis of Prior Cesarean Section  The various methods of treatment have been discussed with the patient and family. After consideration of risks, benefits and other options for treatment, the patient has consented to  Procedure(s) with comments: CESAREAN SECTION (N/A) - RNFA requested - Heather K as a surgical intervention .  The patient's history has been reviewed, patient examined, no change in status, stable for surgery.  I have reviewed the patient's chart and labs.  Questions were answered to the patient's satisfaction.     Delice Lesch

## 2015-11-29 NOTE — Progress Notes (Signed)
Dr. Marcell Barlow notified of HR of 38-42 on admission. No orders received. Will continue to monitor closely.

## 2015-11-29 NOTE — Interval H&P Note (Signed)
History and Physical Interval Note:  11/29/2015 9:22 AM  Sara Shannon  has presented today for surgery, with the diagnosis of Prior Cesarean Section  The various methods of treatment have been discussed with the patient and family. After consideration of risks, benefits and other options for treatment, the patient has consented to  Procedure(s) with comments: CESAREAN SECTION (N/A) - RNFA requested - Heather K as a surgical intervention .  The patient's history has been reviewed, patient examined, no change in status, stable for surgery.  I have reviewed the patient's chart and labs.  Questions were answered to the patient's satisfaction.     Delice Lesch

## 2015-11-30 LAB — CBC
HCT: 28.7 % — ABNORMAL LOW (ref 36.0–46.0)
Hemoglobin: 9.8 g/dL — ABNORMAL LOW (ref 12.0–15.0)
MCH: 32.3 pg (ref 26.0–34.0)
MCHC: 34.1 g/dL (ref 30.0–36.0)
MCV: 94.7 fL (ref 78.0–100.0)
Platelets: 168 K/uL (ref 150–400)
RBC: 3.03 MIL/uL — ABNORMAL LOW (ref 3.87–5.11)
RDW: 14.5 % (ref 11.5–15.5)
WBC: 14.3 K/uL — ABNORMAL HIGH (ref 4.0–10.5)

## 2015-11-30 LAB — BIRTH TISSUE RECOVERY COLLECTION (PLACENTA DONATION)

## 2015-11-30 NOTE — Discharge Instructions (Signed)

## 2015-11-30 NOTE — Progress Notes (Signed)
Subjective: Postpartum Day 1: Cesarean Delivery Patient reports incisional pain, tolerating PO and no problems voiding.  Has not needed percocet.  Objective: Vital signs in last 24 hours: Temp:  [97.5 F (36.4 C)-98.3 F (36.8 C)] 97.7 F (36.5 C) (08/26 0547) Pulse Rate:  [42-63] 50 (08/26 0547) Resp:  [9-22] 18 (08/26 0547) BP: (96-114)/(46-71) 106/64 (08/26 0547) SpO2:  [93 %-99 %] 99 % (08/26 0547)  Physical Exam:  General: alert and no distress Lochia: appropriate Uterine Fundus: firm Incision: dressing intact, slightly stained DVT Evaluation: No evidence of DVT seen on physical exam.   Recent Labs  11/28/15 1100 11/30/15 0645  HGB 12.9 9.8*  HCT 37.4 28.7*    Assessment/Plan: Status post Cesarean section. Doing well postoperatively.  Continue current care. Good UOP - voiding spontaneously Blaise Palladino Y 11/30/2015, 10:35 AM

## 2015-11-30 NOTE — Discharge Summary (Signed)
[facility] Discharge Summary   Patient Name:   Sara Shannon DOB:     03-Dec-1980 MRN:     [id number]  Date of Admission:   11/29/2015 Date of Discharge:  12/01/2015  Admitting diagnosis:    Prior Cesarean Section Principal Problem:   Status post repeat low transverse cesarean section Active Problems:   Hx of cesarean section  Patient Active Problem List   Diagnosis Date Noted  . Status post repeat low transverse cesarean section 11/29/2015  . Hx of cesarean section 08/28/2015  . History of miscarriage, currently pregnant 08/28/2015  . Anxiety disorder 08/28/2015  . Hypothyroidism 08/28/2015  . Vitamin D deficiency 08/28/2015  . Rubella non-immune status, antepartum 08/28/2015  . GERD (gastroesophageal reflux disease) 08/28/2015  . Abnormal Pap smear of cervix 06/04/2009      Discharge diagnosis:    Prior Cesarean Section Principal Problem:   Status post repeat low transverse cesarean section Active Problems:   Hx of cesarean section  Patient Active Problem List   Diagnosis Date Noted  . Status post repeat low transverse cesarean section 11/29/2015  . Hx of cesarean section 08/28/2015  . History of miscarriage, currently pregnant 08/28/2015  . Anxiety disorder 08/28/2015  . Hypothyroidism 08/28/2015  . Vitamin D deficiency 08/28/2015  . Rubella non-immune status, antepartum 08/28/2015  . GERD (gastroesophageal reflux disease) 08/28/2015  . Abnormal Pap smear of cervix 06/04/2009                                                                  Post partum procedures: NA  Type of Delivery:  Repeat LTCS  Delivering Provider: Everett Graff   Date of Delivery:  11/29/15  Newborn Data:    Live born female  Birth Weight: 8 lb 2 oz (3685 g) APGAR: 7, 9  Baby Feeding:   Breast Disposition:   home with mother  Complications:   None  Hospital course:      Sceduled C/S   35 y.o. yo [id number] at [redacted]w[redacted]d was admitted to the hospital 11/29/2015  for scheduled cesarean section with the following indication:Elective Repeat.  Membrane Rupture Time/Date: 9:56 AM ,11/29/2015   Patient delivered a Viable infant.11/29/2015  Details of operation can be found in separate operative note.  Pateint had an uncomplicated postpartum course.  She is ambulating, tolerating a regular diet, passing flatus, and urinating well. Patient is discharged home in stable condition on  12/01/15          Physical Exam:   Vitals:   11/29/15 2200 11/30/15 0547 11/30/15 1801 12/01/15 0615  BP: 110/60 106/64 120/74 112/73  Pulse: (!) 48 (!) 50 69 (!) 48  Resp: 18 18 18 18   Temp: 98.3 F (36.8 C) 97.7 F (36.5 C) 98.2 F (36.8 C) 97.8 F (36.6 C)  TempSrc: Oral Oral Oral   SpO2: 98% 99% 99%    General: alert Lochia: appropriate Uterine Fundus: firm Incision: Dressing is clean, dry, and intact DVT Evaluation: No evidence of DVT seen on physical exam. Negative Homan's sign.  Labs:  CBC Latest Ref Rng & Units 11/30/2015 11/28/2015 11/01/2015  WBC 4.0 - 10.5 K/uL 14.3(H) 9.8 10.9(H)  Hemoglobin 12.0 - 15.0 g/dL 9.8(L) 12.9 11.9(L)  Hematocrit 36.0 - 46.0 % 28.7(L) 37.4  34.9(L)  Platelets 150 - 400 K/uL 168 213 217     Discharge instruction: per After Visit Summary and "Baby and Me Booklet".  After Visit Meds:    Medication List    STOP taking these medications   ondansetron 4 MG disintegrating tablet Commonly known as:  ZOFRAN-ODT   ondansetron 4 MG tablet Commonly known as:  ZOFRAN   zolpidem 5 MG tablet Commonly known as:  AMBIEN     TAKE these medications   acetaminophen 500 MG tablet Commonly known as:  TYLENOL Take 1,000 mg by mouth every 6 (six) hours as needed for mild pain, moderate pain or headache.   famotidine 20 MG tablet Commonly known as:  PEPCID Take 1 tablet (20 mg total) by mouth 2 (two) times daily.   ferrous gluconate 324 MG tablet Commonly known as:  FERGON Take 1 tablet (324 mg total) by mouth daily with  breakfast.   HYDROcodone-acetaminophen 5-325 MG tablet Commonly known as:  NORCO/VICODIN Take 1 tablet by mouth once.   ibuprofen 600 MG tablet Commonly known as:  ADVIL,MOTRIN Take 1 tablet (600 mg total) by mouth every 6 (six) hours as needed.   oxyCODONE 5 MG immediate release tablet Commonly known as:  Oxy IR/ROXICODONE Take 1 tablet (5 mg total) by mouth every 4 (four) hours as needed (pain scale 4-7).   prenatal multivitamin Tabs tablet Take 1 tablet by mouth daily at 12 noon.       Diet: routine diet  Activity: Advance as tolerated. Pelvic rest for 6 weeks.   Outpatient follow up:6 weeks Follow up Appt: Future Appointments Date Time Provider [facility]  12/05/2015 1:00 PM [place] None   Follow up visit: No Follow-up on file.  Postpartum contraception: Undecided  12/01/2015 Donnel Saxon, CNM

## 2015-11-30 NOTE — Lactation Note (Signed)
This note was copied from a baby's chart. Lactation Consultation Note Baby continues to have difficulty with latching. NS #20 initiated and Sara Shannon latched easily and many swallows were heard. Explained to mother that she would need to post-pump at least 6 times in 24 hours. RN to set up double electric breast pump. Follow-up tomorrow or sooner if needed.  Patient Name: [name] [id number] Date: 11/30/2015 Reason for consult: Follow-up assessment   Maternal Data    Feeding Feeding Type: Breast Fed Length of feed: 10 min  LATCH Score/Interventions Latch: Repeated attempts needed to sustain latch, nipple held in mouth throughout feeding, stimulation needed to elicit sucking reflex.  Audible Swallowing: A few with stimulation  Type of Nipple: Everted at rest and after stimulation  Comfort (Breast/Nipple): Soft / non-tender     Hold (Positioning): Assistance needed to correctly position infant at breast and maintain latch.  LATCH Score: 7  Lactation Tools Discussed/Used     Consult Status Consult Status: Follow-up Date: 12/01/15 Follow-up type: In-patient    Van Clines 11/30/2015, 9:32 AM

## 2015-11-30 NOTE — Lactation Note (Signed)
This note was copied from a baby's chart. Lactation Consultation Note Follow up visit at 14 hours of age.  Mom is having difficulty with latching and gave baby a bottle of formula.  Mom gave baby 57mls due to fussiness.  LC advised mom to keep volume of supplement to 5-62mls if she continues to work on Regulatory affairs officer.  Mom has short everted nipples and has a hand pump.  Mom has compressible breasts with hand pump making nipples more evert and encouraged to do before latch attempt.  Mom is able to easily hand express and encouraged mom to use spoon feeding for colostrum and not to use bottles to work on latching baby. LC to follow up with mom tomorrow.    Patient Name: Sara Shannon [id number] Date: 11/30/2015 Reason for consult: Follow-up assessment   Maternal Data Has patient been taught Hand Expression?: Yes Does the patient have breastfeeding experience prior to this delivery?: Yes  Feeding Feeding Type: Breast Fed Length of feed: 5 min (on and off)  [facility] Score/Interventions                      Lactation Tools Discussed/Used     Consult Status      Shoptaw, Justine Null 11/30/2015, 12:07 AM

## 2015-11-30 NOTE — Progress Notes (Signed)
CSW met with pt to discuss her history of anxiety.  Per pt, she doesn't really have a hx of anxiety, but was anxious after finding out she was pregnant while separated from her husband.  Based on this circumstance, her MD prescribed her Buspar to help her "calm her nerves."  Pt denies taking Buspar since her second trimester and doesn't plan on taking it post-partum.   PPD depression discussed.  Pt is agreeable to f/u with her MD at d/c for any changes in mood/depreesion.  Pt reports good family support, including from her estranged husband.  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  No social work needs identified.  CSW signing off.  Please re-consult as necessary.  Jeral Fruit Stephanine Reas,LCSW Weekend Coverage [phone]

## 2015-12-01 MED ORDER — IBUPROFEN 600 MG PO TABS: 600.0000 mg | ORAL_TABLET | Freq: Four times a day (QID) | ORAL | 2 refills | Status: DC | PRN

## 2015-12-01 MED ORDER — HYDROCODONE-ACETAMINOPHEN 5-325 MG PO TABS
1.0000 | ORAL_TABLET | Freq: Once | ORAL | Status: AC
  Administered 2015-12-01: 1 via ORAL
  Filled 2015-12-01: qty 1

## 2015-12-01 MED ORDER — OXYCODONE HCL 5 MG PO TABS: 5.0000 mg | ORAL_TABLET | ORAL | 0 refills | Status: DC | PRN

## 2015-12-01 MED ORDER — HYDROCODONE-ACETAMINOPHEN 5-325 MG PO TABS: 1.0000 | ORAL_TABLET | Freq: Once | ORAL | 0 refills | Status: AC

## 2015-12-01 NOTE — Progress Notes (Signed)
Sara Shannon [id number]  Subjective: Postpartum Day 2: Repeat C/S, scheduled Patient up ad lib, reports no syncope or dizziness. Thought she was taking hydrocodone, since she has had previous GI upsets with oxycodone--she received 5 mg oxycodone at 1233 without any rxn. Feeding:  Breast Contraceptive plan:  Undecided  Originally was interested in d/c home today, "now not sure", due to being more sore today and baby having lost weight.  Objective: Temp:  [98.2 F (36.8 C)] 98.2 F (36.8 C) (08/26 1801) Pulse Rate:  [69] 69 (08/26 1801) Resp:  [18] 18 (08/26 1801) BP: (120)/(74) 120/74 (08/26 1801) SpO2:  [99 %] 99 % (08/26 1801)  CBC Latest Ref Rng & Units 11/30/2015 11/28/2015 11/01/2015  WBC 4.0 - 10.5 K/uL 14.3(H) 9.8 10.9(H)  Hemoglobin 12.0 - 15.0 g/dL 9.8(L) 12.9 11.9(L)  Hematocrit 36.0 - 46.0 % 28.7(L) 37.4 34.9(L)  Platelets 150 - 400 K/uL 168 213 217     Physical Exam:  General: alert Lochia: appropriate Uterine Fundus: firm Abdomen:  + bowel sounds Incision: Honeycomb dressing CDI DVT Evaluation: No evidence of DVT seen on physical exam. Negative Homan's sign.   Assessment/Plan: Status post cesarean delivery, day 2. Stable Continue current care. Assess later today for possible d/c. OK for oxycodone use at 5 mg dose level.    Donnel Saxon MSN, CNM 12/01/2015, 6:11 AM

## 2015-12-01 NOTE — Lactation Note (Signed)
This note was copied from a baby's chart. Lactation Consultation Note  Baby is BF well with the NS. Many swallows were heard at this feeding. Encouraged mom to continue trying to wean from the NS. Information given on appropriate output. Follow-up appointment scheduled.  Aware of support groups.  Patient Name: Sara Shannon Today's Date: 12/01/2015     Maternal Data    Feeding Feeding Type: Breast Fed  LATCH Score/Interventions Latch: Grasps breast easily, tongue down, lips flanged, rhythmical sucking.  Audible Swallowing: Spontaneous and intermittent  Type of Nipple: Everted at rest and after stimulation  Comfort (Breast/Nipple): Soft / non-tender     Hold (Positioning): No assistance needed to correctly position infant at breast.  LATCH Score: 10  Lactation Tools Discussed/Used     Consult Status      Van Clines 12/01/2015, 12:39 PM

## 2015-12-05 ENCOUNTER — Ambulatory Visit (HOSPITAL_COMMUNITY)
Admission: RE | Admit: 2015-12-05 | Discharge: 2015-12-05 | Disposition: A | Discharge status: Home / Self Care | Payer: 59 | Source: Ambulatory Visit | Attending: Obstetrics and Gynecology | Admitting: Obstetrics and Gynecology

## 2015-12-05 NOTE — Lactation Note (Signed)
Lactation Consult  Mother's reason for visit:  Trouble weaning from nipple shield Visit Type:  Outpatient Appointment Notes: Mother has been having difficulty weaning baby from nipple shield and seems stressed.  Recommend mother hand express into nipple shield (NS) prior to latching.  Latch baby and half way through feeding, recommend mother attempt latching without NS.  Tried this and baby sustained latch without NS for more than 15 min.  Continue post pumping 1-2 times per day and if hungry give volume back to baby at next feeding.  Noted short anterior lingual frenulum contributing to limited protrusion of tongue.  Provided frenulum resource information sheets to mother.   Baby transferred 68 ml at the breast.  Post feed weight 7 lb 12.4 oz. Consult:  Initial Lactation Consultant:  Carlye Grippe  ________________________________________________________________________ Sara Shannon Name:  Sara Shannon Date of Birth:  11/29/2013 Pediatrician:  [facility] Gender:  female Gestational Age: [redacted]w[redacted]d (At Birth) Birth Weight:  8 lb 2 oz (3685 g) Weight at Discharge:  Weight: 7 lb 10.8 oz (3480 g)                                 Date of Discharge:  12/01/2015      Filed Weights   11/29/15 0957 11/30/15 0028 11/30/15 2345  Weight: 8 lb 2 oz (3685 g) 7 lb 14.6 oz (3590 g) 7 lb 10.8 oz (3480 g)  Last weight taken from location outside of [facility]:  7 lb 9 oz     Location:Pediatrician's office Weight today:  7 lb 10.4 oz.________________________________________________________________________  Mother's Name: Sara Shannon Type of delivery:   Breastfeeding Experience:  P2, Breastfed other child for 6 monthss Maternal Medications:  PNV ________________________________________________________________________  Breastfeeding History (Post Discharge)  Frequency of breastfeeding:  q 2-3 hours Duration of feeding:  30 min    Pumping  Type of pump:  Medela pump in  style Frequency:  2 times per day Volume:  20 ml   Infant Intake and Output Assessment  Voids:  6 in 24 hrs.  Color:  Clear yellow Stools:  6 in 24 hrs.  Color:  Yellow  ________________________________________________________________________  Maternal Breast Assessment  Breast:  Filling Nipple:  Erect _______________________________________________________________________ Feeding Assessment/Evaluation  Initial feeding assessment:    Infant's oral assessment:  Variance  Positioning:  Cross cradle Left breast  LATCH documentation:  Latch:  2 = Grasps breast easily, tongue down, lips flanged, rhythmical sucking.  Audible swallowing:  1 = A few with stimulation  Type of nipple:  2 = Everted at rest and after stimulation  Comfort (Breast/Nipple):  2 = Soft / non-tender  Hold (Positioning):  2 = No assistance needed to correctly position infant at breast  LATCH score:  9  Attached assessment:  Deep  Lips flanged:  No.  Lips untucked:  Yes.    Suck assessment:  Displays both  Tools:  Nipple shield 20 mm Instructed on use and cleaning of tool:  Yes.    Pre-feed weight:  3526 g  Post-feed weight:  3570 g  Amount transferred:  44 ml  Additional Feeding Assessment -   Infant's oral assessment:  Variance  Positioning:  Cross cradle Right breast  LATCH documentation:  Latch:  1 = Repeated attempts needed to sustain latch, nipple held in mouth throughout feeding, stimulation needed to elicit sucking reflex.  Audible swallowing:  1 = A few with stimulation  Type of  nipple:  2 = Everted at rest and after stimulation  Comfort (Breast/Nipple):  1 = Filling, red/small blisters or bruises, mild/mod discomfort  Hold (Positioning):  1 = Assistance needed to correctly position infant at breast and maintain latch  LATCH score:  6  Attached assessment:  Deep  Lips flanged:  No.  Lips untucked:  Yes.    Suck assessment:  Displays both  Tools:  Nipple shield 20  mm Instructed on use and cleaning of tool:  Yes.    Pre-feed weight:  3570 g  Post-feed weight:  3594 g Amount transferred:  24 ml  Total amount transferred:  68 ml

## 2015-12-16 ENCOUNTER — Telehealth (HOSPITAL_COMMUNITY): Payer: Self-pay

## 2015-12-16 NOTE — Telephone Encounter (Signed)
Mom reports having a tongue tie revision and labial frenulum lasered today.  Mom is using a NS and supplementing with breast milk and formula.  Baby has had a weight gain today since Friday.  Encouraged mom to continue plan as she is gaining and needed supplement support at this time.  Encouraged mom to continue to attempt feedings without NS as desired.  Mom has been pumping about twice daily and encouraged mom to pump when offering supplement to increase her supply. Mom does report to being a frequent smoker and discussed feeding and pumping before smoking due to nicotine affect on let down.  Questions answered and scheduled appointment for  9/15 at 2:30.

## 2015-12-17 ENCOUNTER — Ambulatory Visit (HOSPITAL_COMMUNITY)
Admission: RE | Admit: 2015-12-17 | Discharge: 2015-12-17 | Disposition: A | Discharge status: Home / Self Care | Payer: 59 | Source: Ambulatory Visit | Attending: Obstetrics and Gynecology | Admitting: Obstetrics and Gynecology

## 2015-12-17 NOTE — Lactation Note (Addendum)
Lactation Consult  Mother's reason for visit:  Follow-up post revision nad nipple pain Visit Type:  OP Appointment Notes:  Sara Shannon is here with [name] post revision.  Sara Shannon is reporting pain with the NS. She has not latched Sara Shannon Shannon since yesterday related to nipple pain.Tummy time and neck ROM were done as were jaw massage and sucking exercises. These maneuvers helped her to use her tongue better and mom reported increased comfort.  Sara Shannon Shannon is having a little difficulty holding onto the breast without the NS but anticipate that as she gets stronger she will maintain the latch better. She latched well with the NS and maintained the latch. Many swallows were heard and she transferred 1.3. Reverse pressure was used to soften the areolar area and she latched to the bare breast and transferred 0.7 oz. She was repositioned on the left and again on the right for a total transfer of 2.8 oz. Feeding took about 50 minutes but suspect time will decrease as she gets stronger.  Another appointment is scheduled for Friday. Support groups encouraged.   Consult:  Initial Lactation Consultant:  Van Clines  ________________________________________________________________________ Sara Shannon Shannon Name:  Sara Shannon Shannon Date of Birth:  11/29/2013 Pediatrician:  summer Gender:  female Gestational Age: [redacted]w[redacted]d (At Birth) Birth Weight:  8 lb 2 oz (3685 g) Weight at Discharge:  Weight: 7 lb 10.8 oz (3480 g)                                 Date of Discharge:  12/01/2015      Filed Weights   11/29/15 0957 11/30/15 0028 11/30/15 2345  Weight: 8 lb 2 oz (3685 g) 7 lb 14.6 oz (3590 g) 7 lb 10.8 oz (3480 g)  Last weight taken from location outside of [facility]:  8#4 oz     Location:Pediatrician's office Weight today:  8# 4.9    ________________________________________________________________________  Mother's Name: Sara Shannon Shannon Type of delivery:  cesarean Breastfeeding Experience:  BF son for 3-4  months Maternal Medical Conditions:  NA Maternal Medications:  PNV  ________________________________________________________________________  Breastfeeding History (Post Discharge)  Frequency of breastfeeding:  8-9 times Usually receives 1.5-2 oz after each feeding to increase weight. If given just a bottle gets 3-4 oz  Duration of feeding:  20-30 minutes  Output Assessment    Voids:  6 in 24 hrs.  Color:  Clear yellow Stools:  3+ in 24 hrs.  Color:  Yellow  ________________________________________________________________________  Maternal Breast Assessment    _______________________________________________________________________

## 2015-12-20 ENCOUNTER — Inpatient Hospital Stay (HOSPITAL_COMMUNITY): Admission: RE | Admit: 2015-12-20 | Payer: 59 | Source: Ambulatory Visit

## 2016-01-21 DIAGNOSIS — Z30430 Encounter for insertion of intrauterine contraceptive device: Secondary | ICD-10-CM | POA: Diagnosis not present

## 2016-04-06 ENCOUNTER — Emergency Department
Admission: EM | Admit: 2016-04-06 | Discharge: 2016-04-06 | Disposition: A | Discharge status: Home / Self Care | Payer: 59 | Source: Home / Self Care | Attending: Family Medicine | Admitting: Family Medicine

## 2016-04-06 ENCOUNTER — Encounter: Payer: Self-pay | Admitting: Emergency Medicine

## 2016-04-06 DIAGNOSIS — Z23 Encounter for immunization: Secondary | ICD-10-CM | POA: Diagnosis not present

## 2016-04-06 DIAGNOSIS — S61217A Laceration without foreign body of left little finger without damage to nail, initial encounter: Secondary | ICD-10-CM | POA: Diagnosis not present

## 2016-04-06 MED ORDER — ACETAMINOPHEN 325 MG PO TABS
975.0000 mg | ORAL_TABLET | Freq: Once | ORAL | Status: AC
  Administered 2016-04-06: 975 mg via ORAL

## 2016-04-06 MED ORDER — TETANUS-DIPHTH-ACELL PERTUSSIS 5-2.5-18.5 LF-MCG/0.5 IM SUSP
0.5000 mL | Freq: Once | INTRAMUSCULAR | Status: AC
  Administered 2016-04-06: 0.5 mL via INTRAMUSCULAR

## 2016-04-06 NOTE — ED Triage Notes (Signed)
Patient is still breast feeding.

## 2016-04-06 NOTE — Discharge Instructions (Signed)
Change dressing daily and apply Bacitracin ointment to wound.  Keep wound clean and dry.  Return for any signs of infection (or follow-up with family doctor):  Increasing redness, swelling, pain, heat, drainage, etc. °Return in 10 days for suture removal.   °

## 2016-04-06 NOTE — ED Triage Notes (Signed)
Patient just cut left little finger with kitchen knife. Uncertain last Tdap.

## 2016-04-06 NOTE — ED Provider Notes (Signed)
[name]    CSN: [id number] Arrival date & time: 04/06/16  1424     History   Chief Complaint Chief Complaint  Patient presents with  . Finger Injury    left small    HPI Sara Shannon is a 36 y.o. female.   Patient lacerated her left 5th finger one hour ago while using a kitchen knife.   The history is provided by the patient.  Laceration  Location:  Finger Finger laceration location:  L little finger Length:  1.5cm Depth:  Through dermis Quality: straight   Bleeding: controlled   Time since incident:  1 hour Laceration mechanism:  Knife Pain details:    Quality:  Dull   Severity:  Mild   Timing:  Constant   Progression:  Unchanged Foreign body present:  No foreign bodies Relieved by:  Nothing Worsened by:  Movement Ineffective treatments:  None tried Tetanus status:  Out of date Associated symptoms: no fever, no focal weakness, no numbness and no swelling     Past Medical History:  Diagnosis Date  . Abnormal Pap smear of cervix 06/2009   pos HR HPV with colpo--"precancerous cells" found per pt. 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 Hypothyroidism   . Thyroid disease    middle school, returned to normal after childbirth    Patient Active Problem List   Diagnosis Date Noted  . Status post repeat low transverse cesarean section 11/29/2015  . Hx of cesarean section 08/28/2015  . History of miscarriage, currently pregnant 08/28/2015  . Anxiety disorder 08/28/2015  . Hypothyroidism 08/28/2015  . Vitamin D deficiency 08/28/2015  . Rubella non-immune status, antepartum 08/28/2015  . GERD (gastroesophageal reflux disease) 08/28/2015  . Abnormal Pap smear of cervix 06/04/2009    Past Surgical History:  Procedure Laterality Date  . CESAREAN SECTION  10/20/02  . CESAREAN SECTION N/A 11/29/2015   Procedure: CESAREAN SECTION;  Surgeon: Everett Graff, MD;  Location: [place];  Service: Obstetrics;   Laterality: N/A;  RNFA requested - Heather K  . COLPOSCOPY  06/2009   "precancerous cells" on biopsy  . INTRAUTERINE DEVICE INSERTION  06/14/09   Mirena  . TONSILLECTOMY AND ADENOIDECTOMY  1992    OB History    Gravida Para Term Preterm AB Living   3 2 2   1 2    SAB TAB Ectopic Multiple Live Births   1     0 2       Home Medications    Prior to Admission medications   Medication Sig Start Date End Date Taking? Authorizing Provider  acetaminophen (TYLENOL) 500 MG tablet Take 1,000 mg by mouth every 6 (six) hours as needed for mild pain, moderate pain or headache.    Historical Provider, MD  famotidine (PEPCID) 20 MG tablet Take 1 tablet (20 mg total) by mouth 2 (two) times daily. 11/03/15   Waymon Amato, MD  ferrous gluconate (FERGON) 324 MG tablet Take 1 tablet (324 mg total) by mouth daily with breakfast. 08/29/15   Lavetta Nielsen, CNM  ibuprofen (ADVIL,MOTRIN) 600 MG tablet Take 1 tablet (600 mg total) by mouth every 6 (six) hours as needed. 12/01/15   Waymon Amato, MD  oxyCODONE (OXY IR/ROXICODONE) 5 MG immediate release tablet Take 1 tablet (5 mg total) by mouth every 4 (four) hours as needed (pain scale 4-7). 12/01/15   Waymon Amato, MD  Prenatal Vit-Fe Fumarate-FA (PRENATAL MULTIVITAMIN) TABS tablet Take 1 tablet by  mouth daily at 12 noon. 11/03/15   Waymon Amato, MD    Family History Family History  Problem Relation Age of Onset  . Stroke Father 2    blocked carotid artery, left side  . Emphysema Maternal Grandmother     smoker  . Stroke Maternal Grandfather 57  . Dementia Paternal Grandmother   . Cancer Paternal Grandfather     lung, smoker?    Social History Social History  Substance Use Topics  . Smoking status: Current Every Day Smoker    Packs/day: 0.50    Types: Cigarettes  . Smokeless tobacco: Never Used  . Alcohol use No     Allergies   Patient has no known allergies.   Review of Systems Review of Systems  Constitutional: Negative for fever.  Neurological:  Negative for focal weakness.  All other systems reviewed and are negative.    Physical Exam Triage Vital Signs ED Triage Vitals  Enc Vitals Group     BP 04/06/16 1439 124/86     Pulse Rate 04/06/16 1439 81     Resp 04/06/16 1439 16     Temp 04/06/16 1439 98.3 F (36.8 C)     Temp Source 04/06/16 1439 Oral     SpO2 04/06/16 1439 100 %     Weight --      Height --      Head Circumference --      Peak Flow --      Pain Score 04/06/16 1445 3     Pain Loc --      Pain Edu? --      Excl. in Winter? --    No data found.   Updated Vital Signs BP 124/86 (BP Location: Left Arm)   Pulse 81   Temp 98.3 F (36.8 C) (Oral)   Resp 16   LMP 02/28/2016 (Exact Date)   SpO2 100%   Visual Acuity Right Eye Distance:   Left Eye Distance:   Bilateral Distance:    Right Eye Near:   Left Eye Near:    Bilateral Near:     Physical Exam  Constitutional: She appears well-developed and well-nourished. No distress.  HENT:  Head: Normocephalic.  Mouth/Throat: Oropharynx is clear and moist.  Eyes: Pupils are equal, round, and reactive to light.  Neck: Normal range of motion.  Cardiovascular: Normal rate.   Pulmonary/Chest: Effort normal.  Musculoskeletal:       Hands: Palmar surface of middle phalanx of 5th finger of left hand has 1.5cm long simple superficial laceration  as noted on diagram.  Finger has full range of motion.  Distal neurovascular function is intact.   Neurological: She is alert.  Skin: Skin is warm and dry.  Nursing note and vitals reviewed.    UC Treatments / Results  Labs (all labs ordered are listed, but only abnormal results are displayed) Labs Reviewed - No data to display  EKG  EKG Interpretation None       Radiology No results found.  Procedures Procedures Laceration Repair Discussed benefits and risks of procedure and verbal consent obtained. Using sterile technique and digital anesthesia with 2% lidocaine without epinephrine, cleansed wound with  Betadine followed by copious lavage with normal saline.  Wound carefully inspected for debris and foreign bodies; none found.  Wound closed with #4, 4-0 interrupted nylon sutures.  Bacitracin and non-stick sterile dressing applied.  Wound precautions explained to patient.  Return for suture removal in 10 days.   Medications Ordered in UC  Medications  acetaminophen (TYLENOL) tablet 975 mg (975 mg Oral Given 04/06/16 1446)  Tdap (BOOSTRIX) injection 0.5 mL (0.5 mLs Intramuscular Given 04/06/16 0230)     Initial Impression / Assessment and Plan / UC Course  I have reviewed the triage vital signs and the nursing notes.  Pertinent labs & imaging results that were available during my care of the patient were reviewed by me and considered in my medical decision making (see chart for details).  Clinical Course   Administered Tdap  Change dressing daily and apply Bacitracin ointment to wound.  Keep wound clean and dry.  Return for any signs of infection (or follow-up with family doctor):  Increasing redness, swelling, pain, heat, drainage, etc. Return in 10 days for suture removal.       Final Clinical Impressions(s) / UC Diagnoses   Final diagnoses:  Laceration of left little finger without foreign body without damage to nail, initial encounter    New Prescriptions New Prescriptions   No medications on file     Kandra Nicolas, MD 04/22/16 1433

## 2016-04-17 ENCOUNTER — Ambulatory Visit: Payer: 59 | Admitting: Physician Assistant

## 2016-05-07 ENCOUNTER — Telehealth: Payer: 59 | Admitting: Family

## 2016-05-07 DIAGNOSIS — R6889 Other general symptoms and signs: Secondary | ICD-10-CM | POA: Diagnosis not present

## 2016-05-07 MED ORDER — OSELTAMIVIR PHOSPHATE 75 MG PO CAPS: 75.0000 mg | ORAL_CAPSULE | Freq: Two times a day (BID) | ORAL | 0 refills | Status: DC

## 2016-05-07 NOTE — Progress Notes (Signed)

## 2016-05-15 ENCOUNTER — Telehealth: Payer: 59 | Admitting: Family

## 2016-05-15 DIAGNOSIS — R6889 Other general symptoms and signs: Secondary | ICD-10-CM

## 2016-05-15 DIAGNOSIS — J111 Influenza due to unidentified influenza virus with other respiratory manifestations: Secondary | ICD-10-CM | POA: Diagnosis not present

## 2016-05-15 MED ORDER — OSELTAMIVIR PHOSPHATE 75 MG PO CAPS: 75.0000 mg | ORAL_CAPSULE | Freq: Two times a day (BID) | ORAL | 0 refills | Status: DC

## 2016-05-15 NOTE — Progress Notes (Signed)
E visit for Flu like symptoms   We are sorry that you are not feeling well.  Here is how we plan to help! Based on what you have shared with me it looks like you may have possible exposure to a virus that causes influenza.  Influenza or "the flu" is   an infection caused by a respiratory virus. The flu virus is highly contagious and persons who did not receive their yearly flu vaccination may "catch" the flu from close contact.  We have anti-viral medications to treat the viruses that cause this infection. They are not a "cure" and only shorten the course of the infection. These prescriptions are most effective when they are given within the first 2 days of "flu" symptoms. Antiviral medication are indicated if you have a high risk of complications from the flu. You should  also consider an antiviral medication if you are in close contact with someone who is at risk. These medications can help patients avoid complications from the flu  but have side effects that you should know. Possible side effects from Tamiflu or oseltamivir include nausea, vomiting, diarrhea, dizziness, headaches, eye redness, sleep problems or other respiratory symptoms. You should not take Tamiflu if you have an allergy to oseltamivir or any to the ingredients in Tamiflu.  Based upon your symptoms and potential risk factors I have prescribed Oseltamivir (Tamiflu).  It has been sent to your designated pharmacy.  You will take one 75 mg capsule orally twice a day for the next 5 days.   Please complete the entire course.   ANYONE WHO HAS FLU SYMPTOMS SHOULD: . Stay home. The flu is highly contagious and going out or to work exposes others! . Be sure to drink plenty of fluids. Water is fine as well as fruit juices, sodas and electrolyte beverages. You may want to stay away from caffeine or alcohol. If you are nauseated, try taking small sips of liquids. How do you know if you are getting enough fluid? Your urine should be a pale  yellow or almost colorless. . Get rest. . Taking a steamy shower or using a humidifier may help nasal congestion and ease sore throat pain. Using a saline nasal spray works much the same way. . Cough drops, hard candies and sore throat lozenges may ease your cough. . Line up a caregiver. Have someone check on you regularly.   GET HELP RIGHT AWAY IF: . You cannot keep down liquids or your medications. . You become short of breath . Your fell like you are going to pass out or loose consciousness. . Your symptoms persist after you have completed your treatment plan MAKE SURE YOU   Understand these instructions.  Will watch your condition.  Will get help right away if you are not doing well or get worse.  Your e-visit answers were reviewed by a board certified advanced clinical practitioner to complete your personal care plan.  Depending on the condition, your plan could have included both over the counter or prescription medications.  If there is a problem please reply  once you have received a response from your provider.  Your safety is important to [place].  If you have drug allergies check your prescription carefully.    You can use MyChart to ask questions about today's visit, request a non-urgent call back, or ask for a work or school excuse for 24 hours related to this e-Visit. If it has been greater than 24 hours you will need to follow  up with your provider, or enter a new e-Visit to address those concerns.  You will get an e-mail in the next two days asking about your experience.  I hope that your e-visit has been valuable and will speed your recovery. Thank you for using e-visits.

## 2017-02-08 DIAGNOSIS — R87610 Atypical squamous cells of undetermined significance on cytologic smear of cervix (ASC-US): Secondary | ICD-10-CM | POA: Diagnosis not present

## 2017-02-08 DIAGNOSIS — N898 Other specified noninflammatory disorders of vagina: Secondary | ICD-10-CM | POA: Diagnosis not present

## 2017-02-08 DIAGNOSIS — N760 Acute vaginitis: Secondary | ICD-10-CM | POA: Diagnosis not present

## 2017-02-08 DIAGNOSIS — R87611 Atypical squamous cells cannot exclude high grade squamous intraepithelial lesion on cytologic smear of cervix (ASC-H): Secondary | ICD-10-CM | POA: Diagnosis not present

## 2017-02-08 DIAGNOSIS — R87619 Unspecified abnormal cytological findings in specimens from cervix uteri: Secondary | ICD-10-CM | POA: Diagnosis not present

## 2017-02-23 DIAGNOSIS — R87619 Unspecified abnormal cytological findings in specimens from cervix uteri: Secondary | ICD-10-CM | POA: Diagnosis not present

## 2017-02-23 DIAGNOSIS — R87611 Atypical squamous cells cannot exclude high grade squamous intraepithelial lesion on cytologic smear of cervix (ASC-H): Secondary | ICD-10-CM | POA: Diagnosis not present

## 2017-02-24 DIAGNOSIS — D060 Carcinoma in situ of endocervix: Secondary | ICD-10-CM | POA: Diagnosis not present

## 2017-03-10 ENCOUNTER — Other Ambulatory Visit: Payer: Self-pay | Admitting: Obstetrics and Gynecology

## 2017-03-11 DIAGNOSIS — F419 Anxiety disorder, unspecified: Secondary | ICD-10-CM | POA: Diagnosis not present

## 2017-03-11 DIAGNOSIS — Z09 Encounter for follow-up examination after completed treatment for conditions other than malignant neoplasm: Secondary | ICD-10-CM | POA: Diagnosis not present

## 2017-03-11 DIAGNOSIS — D069 Carcinoma in situ of cervix, unspecified: Secondary | ICD-10-CM | POA: Diagnosis not present

## 2017-03-11 DIAGNOSIS — D259 Leiomyoma of uterus, unspecified: Secondary | ICD-10-CM | POA: Diagnosis not present

## 2017-03-15 ENCOUNTER — Other Ambulatory Visit: Payer: Self-pay

## 2017-03-15 ENCOUNTER — Encounter (HOSPITAL_COMMUNITY): Payer: Self-pay | Admitting: *Deleted

## 2017-03-22 ENCOUNTER — Ambulatory Visit (HOSPITAL_COMMUNITY)
Admission: RE | Admit: 2017-03-22 | Discharge: 2017-03-22 | Disposition: A | Discharge status: Home / Self Care | Payer: 59 | Source: Ambulatory Visit | Attending: Emergency Medicine | Admitting: Emergency Medicine

## 2017-03-22 ENCOUNTER — Ambulatory Visit
Admission: RE | Admit: 2017-03-22 | Discharge: 2017-03-22 | Disposition: A | Discharge status: Home / Self Care | Payer: 59 | Attending: Emergency Medicine | Admitting: Emergency Medicine

## 2017-03-22 ENCOUNTER — Other Ambulatory Visit (HOSPITAL_COMMUNITY): Payer: Self-pay | Admitting: Emergency Medicine

## 2017-03-22 DIAGNOSIS — R059 Cough, unspecified: Secondary | ICD-10-CM

## 2017-03-22 DIAGNOSIS — R05 Cough: Secondary | ICD-10-CM | POA: Insufficient documentation

## 2017-03-23 ENCOUNTER — Ambulatory Visit (HOSPITAL_COMMUNITY): Payer: 59 | Admitting: Certified Registered Nurse Anesthetist

## 2017-03-23 ENCOUNTER — Encounter (HOSPITAL_COMMUNITY): Payer: Self-pay | Admitting: Emergency Medicine

## 2017-03-23 ENCOUNTER — Encounter (HOSPITAL_COMMUNITY)
Admission: AD | Disposition: A | Discharge status: Home / Self Care | Payer: Self-pay | Source: Ambulatory Visit | Attending: Obstetrics and Gynecology

## 2017-03-23 ENCOUNTER — Ambulatory Visit (HOSPITAL_COMMUNITY)
Admission: AD | Admit: 2017-03-23 | Discharge: 2017-03-23 | Disposition: A | Discharge status: Home / Self Care | Payer: 59 | Source: Ambulatory Visit | Attending: Obstetrics and Gynecology | Admitting: Obstetrics and Gynecology

## 2017-03-23 DIAGNOSIS — D060 Carcinoma in situ of endocervix: Secondary | ICD-10-CM | POA: Diagnosis present

## 2017-03-23 DIAGNOSIS — C530 Malignant neoplasm of endocervix: Secondary | ICD-10-CM | POA: Diagnosis not present

## 2017-03-23 DIAGNOSIS — K219 Gastro-esophageal reflux disease without esophagitis: Secondary | ICD-10-CM | POA: Diagnosis not present

## 2017-03-23 DIAGNOSIS — F17210 Nicotine dependence, cigarettes, uncomplicated: Secondary | ICD-10-CM | POA: Insufficient documentation

## 2017-03-23 DIAGNOSIS — C539 Malignant neoplasm of cervix uteri, unspecified: Secondary | ICD-10-CM | POA: Diagnosis not present

## 2017-03-23 DIAGNOSIS — Z79899 Other long term (current) drug therapy: Secondary | ICD-10-CM | POA: Insufficient documentation

## 2017-03-23 DIAGNOSIS — N840 Polyp of corpus uteri: Secondary | ICD-10-CM | POA: Insufficient documentation

## 2017-03-23 DIAGNOSIS — E039 Hypothyroidism, unspecified: Secondary | ICD-10-CM | POA: Diagnosis not present

## 2017-03-23 DIAGNOSIS — R87613 High grade squamous intraepithelial lesion on cytologic smear of cervix (HGSIL): Secondary | ICD-10-CM | POA: Diagnosis not present

## 2017-03-23 DIAGNOSIS — D069 Carcinoma in situ of cervix, unspecified: Secondary | ICD-10-CM | POA: Diagnosis not present

## 2017-03-23 DIAGNOSIS — Z30432 Encounter for removal of intrauterine contraceptive device: Secondary | ICD-10-CM | POA: Diagnosis not present

## 2017-03-23 DIAGNOSIS — F419 Anxiety disorder, unspecified: Secondary | ICD-10-CM | POA: Insufficient documentation

## 2017-03-23 HISTORY — PX: IUD REMOVAL: SHX5392

## 2017-03-23 HISTORY — DX: Anxiety disorder, unspecified: F41.9

## 2017-03-23 HISTORY — PX: CERVICAL CONIZATION W/BX: SHX1330

## 2017-03-23 HISTORY — DX: Malignant (primary) neoplasm, unspecified: C80.1

## 2017-03-23 HISTORY — DX: Gastro-esophageal reflux disease without esophagitis: K21.9

## 2017-03-23 LAB — BASIC METABOLIC PANEL
Anion gap: 8 (ref 5–15)
BUN: 9 mg/dL (ref 6–20)
CHLORIDE: 106 mmol/L (ref 101–111)
CO2: 25 mmol/L (ref 22–32)
CREATININE: 0.74 mg/dL (ref 0.44–1.00)
Calcium: 8.7 mg/dL — ABNORMAL LOW (ref 8.9–10.3)
GFR calc Af Amer: >60 mL/min (ref >60)
GFR calc non Af Amer: >60 mL/min (ref >60)
GLUCOSE: 90 mg/dL (ref 65–99)
Potassium: 4 mmol/L (ref 3.5–5.1)
Sodium: 139 mmol/L (ref 135–145)

## 2017-03-23 LAB — CBC
HCT: 43 % (ref 36.0–46.0)
Hemoglobin: 14.3 g/dL (ref 12.0–15.0)
MCH: 32.4 pg (ref 26.0–34.0)
MCHC: 33.3 g/dL (ref 30.0–36.0)
MCV: 97.5 fL (ref 78.0–100.0)
PLATELETS: 240 10*3/uL (ref 150–400)
RBC: 4.41 MIL/uL (ref 3.87–5.11)
RDW: 13.9 % (ref 11.5–15.5)
WBC: 6 10*3/uL (ref 4.0–10.5)

## 2017-03-23 LAB — PREGNANCY, URINE: Preg Test, Ur: NEGATIVE

## 2017-03-23 SURGERY — CONE BIOPSY, CERVIX
Anesthesia: General | Site: Vagina

## 2017-03-23 MED ORDER — GLYCOPYRROLATE 0.2 MG/ML IJ SOLN
INTRAMUSCULAR | Status: AC
  Filled 2017-03-23: qty 1

## 2017-03-23 MED ORDER — HYDROCODONE-ACETAMINOPHEN 5-300 MG PO TABS: 1.0000 | ORAL_TABLET | Freq: Four times a day (QID) | ORAL | 0 refills | Status: DC | PRN

## 2017-03-23 MED ORDER — PROPOFOL 10 MG/ML IV BOLUS
INTRAVENOUS | Status: AC
  Filled 2017-03-23: qty 20

## 2017-03-23 MED ORDER — DEXAMETHASONE SODIUM PHOSPHATE 10 MG/ML IJ SOLN
INTRAMUSCULAR | Status: DC | PRN
  Administered 2017-03-23: 4 mg via INTRAVENOUS

## 2017-03-23 MED ORDER — KETOROLAC TROMETHAMINE 30 MG/ML IJ SOLN
INTRAMUSCULAR | Status: AC
  Filled 2017-03-23: qty 1

## 2017-03-23 MED ORDER — GLYCOPYRROLATE 0.2 MG/ML IJ SOLN
INTRAMUSCULAR | Status: DC | PRN
  Administered 2017-03-23: 0.1 mg via INTRAVENOUS

## 2017-03-23 MED ORDER — ONDANSETRON HCL 4 MG/2ML IJ SOLN
INTRAMUSCULAR | Status: DC | PRN
  Administered 2017-03-23: 4 mg via INTRAVENOUS

## 2017-03-23 MED ORDER — CEFAZOLIN SODIUM-DEXTROSE 2-3 GM-%(50ML) IV SOLR
INTRAVENOUS | Status: AC
  Filled 2017-03-23: qty 50

## 2017-03-23 MED ORDER — DEXAMETHASONE SODIUM PHOSPHATE 4 MG/ML IJ SOLN
INTRAMUSCULAR | Status: AC
  Filled 2017-03-23: qty 1

## 2017-03-23 MED ORDER — IBUPROFEN 600 MG PO TABS: 600.0000 mg | ORAL_TABLET | Freq: Four times a day (QID) | ORAL | 0 refills | Status: DC | PRN

## 2017-03-23 MED ORDER — LIDOCAINE HCL (CARDIAC) 20 MG/ML IV SOLN
INTRAVENOUS | Status: DC | PRN
  Administered 2017-03-23: 100 mg via INTRAVENOUS

## 2017-03-23 MED ORDER — KETOROLAC TROMETHAMINE 30 MG/ML IJ SOLN
INTRAMUSCULAR | Status: DC | PRN
  Administered 2017-03-23: 30 mg via INTRAVENOUS

## 2017-03-23 MED ORDER — FENTANYL CITRATE (PF) 100 MCG/2ML IJ SOLN
25.0000 ug | INTRAMUSCULAR | Status: DC | PRN
  Administered 2017-03-23: 50 ug via INTRAVENOUS

## 2017-03-23 MED ORDER — ACETIC ACID 4% SOLUTION
Status: DC | PRN
  Administered 2017-03-23: 1 via TOPICAL

## 2017-03-23 MED ORDER — FENTANYL CITRATE (PF) 100 MCG/2ML IJ SOLN
INTRAMUSCULAR | Status: AC
  Filled 2017-03-23: qty 2

## 2017-03-23 MED ORDER — LIDOCAINE HCL (CARDIAC) 20 MG/ML IV SOLN
INTRAVENOUS | Status: AC
  Filled 2017-03-23: qty 5

## 2017-03-23 MED ORDER — ONDANSETRON HCL 4 MG/2ML IJ SOLN
INTRAMUSCULAR | Status: AC
  Filled 2017-03-23: qty 2

## 2017-03-23 MED ORDER — LACTATED RINGERS IV SOLN
INTRAVENOUS | Status: DC
  Administered 2017-03-23: 08:00:00 via INTRAVENOUS

## 2017-03-23 MED ORDER — SCOPOLAMINE 1 MG/3DAYS TD PT72
MEDICATED_PATCH | TRANSDERMAL | Status: AC
  Administered 2017-03-23: 1.5 mg via TRANSDERMAL
  Filled 2017-03-23: qty 1

## 2017-03-23 MED ORDER — CEFAZOLIN SODIUM-DEXTROSE 2-3 GM-%(50ML) IV SOLR
INTRAVENOUS | Status: DC | PRN
  Administered 2017-03-23: 2 g via INTRAVENOUS

## 2017-03-23 MED ORDER — VASOPRESSIN 20 UNIT/ML IV SOLN
INTRAVENOUS | Status: DC | PRN
  Administered 2017-03-23: 10 mL via INTRAMUSCULAR

## 2017-03-23 MED ORDER — SCOPOLAMINE 1 MG/3DAYS TD PT72
1.0000 | MEDICATED_PATCH | Freq: Once | TRANSDERMAL | Status: DC
  Administered 2017-03-23: 1.5 mg via TRANSDERMAL

## 2017-03-23 MED ORDER — SODIUM CHLORIDE 0.9 % IJ SOLN
INTRAMUSCULAR | Status: AC
  Filled 2017-03-23: qty 100

## 2017-03-23 MED ORDER — PROPOFOL 10 MG/ML IV BOLUS
INTRAVENOUS | Status: DC | PRN
  Administered 2017-03-23: 200 mg via INTRAVENOUS

## 2017-03-23 MED ORDER — MIDAZOLAM HCL 2 MG/2ML IJ SOLN
INTRAMUSCULAR | Status: AC
  Filled 2017-03-23: qty 2

## 2017-03-23 MED ORDER — FENTANYL CITRATE (PF) 100 MCG/2ML IJ SOLN
INTRAMUSCULAR | Status: DC | PRN
  Administered 2017-03-23: 25 ug via INTRAVENOUS
  Administered 2017-03-23: 50 ug via INTRAVENOUS

## 2017-03-23 MED ORDER — IODINE STRONG (LUGOLS) 5 % PO SOLN
ORAL | Status: AC
  Filled 2017-03-23: qty 1

## 2017-03-23 MED ORDER — FERRIC SUBSULFATE SOLN
Status: DC | PRN
  Administered 2017-03-23: 1 via TOPICAL

## 2017-03-23 MED ORDER — VASOPRESSIN 20 UNIT/ML IV SOLN
INTRAVENOUS | Status: AC
  Filled 2017-03-23: qty 1

## 2017-03-23 MED ORDER — MIDAZOLAM HCL 2 MG/2ML IJ SOLN
INTRAMUSCULAR | Status: DC | PRN
  Administered 2017-03-23: 2 mg via INTRAVENOUS

## 2017-03-23 MED ORDER — FERRIC SUBSULFATE 259 MG/GM EX SOLN
CUTANEOUS | Status: AC
  Filled 2017-03-23: qty 8

## 2017-03-23 MED ORDER — ACETIC ACID 5 % SOLN
Status: AC
  Filled 2017-03-23: qty 500

## 2017-03-23 SURGICAL SUPPLY — 26 items
APPLICATOR COTTON TIP 6IN STRL (MISCELLANEOUS) ×12 IMPLANT
BLADE SURG 11 STRL SS (BLADE) ×4 IMPLANT
CONTAINER PREFILL 10% NBF 60ML (FORM) ×4 IMPLANT
ELECT BALL LEEP 5MM RED (ELECTRODE) ×4 IMPLANT
ELECT REM PT RETURN 9FT ADLT (ELECTROSURGICAL) ×4
ELECTRODE REM PT RTRN 9FT ADLT (ELECTROSURGICAL) IMPLANT
GLOVE BIO SURGEON STRL SZ7.5 (GLOVE) ×4 IMPLANT
GLOVE BIOGEL PI IND STRL 7.0 (GLOVE) ×2 IMPLANT
GLOVE BIOGEL PI IND STRL 7.5 (GLOVE) ×2 IMPLANT
GLOVE BIOGEL PI INDICATOR 7.0 (GLOVE) ×2
GLOVE BIOGEL PI INDICATOR 7.5 (GLOVE) ×2
GOWN STRL REUS W/TWL LRG LVL3 (GOWN DISPOSABLE) ×8 IMPLANT
PACK VAGINAL MINOR WOMEN LF (CUSTOM PROCEDURE TRAY) ×4 IMPLANT
PAD OB MATERNITY 4.3X12.25 (PERSONAL CARE ITEMS) ×4 IMPLANT
PENCIL BUTTON HOLSTER BLD 10FT (ELECTRODE) ×4 IMPLANT
PENCIL SMOKE EVAC W/HOLSTER (ELECTROSURGICAL) ×2 IMPLANT
SCOPETTES 8  STERILE (MISCELLANEOUS) ×4
SCOPETTES 8 STERILE (MISCELLANEOUS) ×4 IMPLANT
SPONGE SURGIFOAM ABS GEL 12-7 (HEMOSTASIS) IMPLANT
SUT VIC AB 0 CT1 27 (SUTURE) ×8
SUT VIC AB 0 CT1 27XBRD ANBCTR (SUTURE) ×4 IMPLANT
SYR TB 1ML 25GX5/8 (SYRINGE) ×4 IMPLANT
TOWEL OR 17X24 6PK STRL BLUE (TOWEL DISPOSABLE) ×8 IMPLANT
TUBING NON-CON 1/4 X 20 CONN (TUBING) ×3 IMPLANT
TUBING NON-CON 1/4 X 20' CONN (TUBING) ×1
YANKAUER SUCT BULB TIP NO VENT (SUCTIONS) ×4 IMPLANT

## 2017-03-23 NOTE — Op Note (Signed)
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 Florene Glen, PA-C  Findings: Acetowhite around entire tz predominantly at 9-12 o'clock  Pathology: 1.Cervical Cone Specimen 2.ECC  Fluids: 800 cc  UOP: 25 cc  EBL: 20 cc  Complications: None  Procedure: The patient was taken to the operating room after the risks, benefits and alternatives were discussed with the patient. The patient verbalized understanding and consent signed and witnessed. The patient was placed under general anesthesia per anesthesiologist and prepped and draped in the normal sterile fashion.  A TIME OUT was performed per protocol.  A weighted speculum was placed in the patient's vagina and 2 stitches were placed, one at the 3:00 position and one at the 9:00 position. Colposcopy was performed and findings as noted above.  A total of 10 cc of dilute Pitressin was injected in cervix circumferentially. There was 20 units of Pitressin in 100 cc of normal saline. IUD removed without difficulty.  Cold knife cone was performed without difficulty and a stitch placed at 12:00 and specimen sent to pathology. The base of the cervix was cauterized with ball-tipped cautery. Monsel solution was placed to aid hemostasis.  Good hemostasis noted.  All instruments were removed. Sponge lap and needle count was correct. Patient tolerated the procedure well and was awaiting transfer to the recovery room in good condition.

## 2017-03-23 NOTE — Discharge Instructions (Signed)
Cervical Conization, Care After This sheet gives you information about how to care for yourself after your procedure. Your health care provider may also give you more specific instructions. If you have problems or questions, contact your health care provider. What can I expect after the procedure? After the procedure, it is common to have:  A groggy feeling, if you were given medicine to make you fall asleep (general anesthetic).  Cramps that feel similar to menstrual cramps.  Bloody discharge or light to moderate bleeding.  Dark discharge. This discharge may look similar to coffee grounds. This is from the paste that was applied to the cervix to control bleeding.  Follow these instructions at home: Medicines  Take over-the-counter and prescription medicines only as told by your health care provider.  Do not take aspirin until your health care provider says it is okay. Aspirin can cause bleeding.  If you are taking pain medicine: ? You may need to prevent or treat constipation. To do this, your health care provider may recommend that you:  Drink enough fluid to keep your urine clear or pale yellow.  Take over-the-counter or prescription medicines.  Eat foods that are high in fiber, such as fresh fruits and vegetables, whole grains, bran, and beans.  Limit foods that are high in fat and processed sugars, such as fried and sweet foods. ? Do not drive or use heavy machinery. General instructions  You may resume your normal diet unless your health care provider advises you not to do so.  Take showers for the first week. Do not take baths, swim, or use hot tubs until your health care provider says it is okay.  Do not douche, use tampons, or have sex until your health care provider says it is okay.  Avoid activities that require great effort, such as exercises and heavy lifting, for at least 7-14 days.  Keep all follow-up visits as told by your health care provider. This is  important. Contact a health care provider if:  You develop a rash.  You are dizzy or lightheaded.  You feel nauseous or you vomit.  You develop a bad smelling discharge from your vagina. Get help right away if:  You have blood clots or bleeding that is heavier than a normal period. Bleeding that soaks a pad in less than 1 hour is considered heavy bleeding.  You have a fever.  You have increasing cramps.  You faint.  You have pain when you urinate.  You have severe or worsening pain.  Your pain is not relieved when you take medicine.  You have bloody urine.  You vomit. Summary  After the procedure, it is common to have cramps and dark or bloody discharge from your vagina.  Do not douche, use tampons, or have sex until your health care provider says it is okay.  Follow all other activity restrictions as told by your health care provider. This information is not intended to replace advice given to you by your health care provider. Make sure you discuss any questions you have with your health care provider. Document Released: 03/23/2005 Document Revised: 03/25/2016 Document Reviewed: 03/25/2016 Elsevier Interactive Patient Education  2017 [employer] [facility] Instructions  NO IBUPROFEN PRODUCTS UNTIL: 3:30 TODAY  Activity: Get plenty of rest for the remainder of the day. A responsible individual must stay with you for 24 hours following the procedure.  For the next 24 hours, DO NOT: -Drive a car -Paediatric nurse -Drink alcoholic beverages -  Take any medication unless instructed by your physician -Make any legal decisions or sign important papers.  Meals: Start with liquid foods such as gelatin or soup. Progress to regular foods as tolerated. Avoid greasy, spicy, heavy foods. If nausea and/or vomiting occur, drink only clear liquids until the nausea and/or vomiting subsides. Call your physician if vomiting continues.  Special  Instructions/Symptoms: Your throat may feel dry or sore from the anesthesia or the breathing tube placed in your throat during surgery. If this causes discomfort, gargle with warm salt water. The discomfort should disappear within 24 hours.  If you had a scopolamine patch placed behind your ear for the management of post- operative nausea and/or vomiting:  1. The medication in the patch is effective for 72 hours, after which it should be removed.  Wrap patch in a tissue and discard in the trash. Wash hands thoroughly with soap and water. 2. You may remove the patch earlier than 72 hours if you experience unpleasant side effects which may include dry mouth, dizziness or visual disturbances. 3. Avoid touching the patch. Wash your hands with soap and water after contact with the patch.

## 2017-03-23 NOTE — H&P (Signed)
Sara Shannon is an 36 y.o. female. 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  Pt has a h/o recurrent abnl paps.  She is scheduled for [facility].    Past Medical History:  Diagnosis Date  . Abnormal Pap smear of cervix 06/2009   pos HR HPV with colpo--"precancerous cells" found per pt. 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 Anxiety    prescribed zoloft not taking  . Cancer ([place])    abnormal pap smear  . GERD (gastroesophageal reflux disease)   . Hypothyroidism    no meds back to normal per patient  . Thyroid disease    middle school, returned to normal after childbirth    Past Surgical History:  Procedure Laterality Date  . CESAREAN SECTION  10/20/02  . CESAREAN SECTION N/A 11/29/2015   Procedure: CESAREAN SECTION;  Surgeon: Everett Graff, MD;  Location: [place];  Service: Obstetrics;  Laterality: N/A;  RNFA requested - Heather K  . COLPOSCOPY  06/2009   "precancerous cells" on biopsy  . INTRAUTERINE DEVICE INSERTION  06/14/09   Mirena  . TONSILLECTOMY    . TONSILLECTOMY AND ADENOIDECTOMY  1992    Family History  Problem Relation Age of Onset  . Stroke Father 86       blocked carotid artery, left side  . Emphysema Maternal Grandmother        smoker  . Stroke Maternal Grandfather 60  . Dementia Paternal Grandmother   . Cancer Paternal Grandfather        lung, smoker?    Social History:  reports that she has been smoking cigarettes.  She has been smoking about 0.50 packs per day. she has never used smokeless tobacco. She reports that she does not drink alcohol or use drugs.  Allergies: No Known Allergies  Medications Prior to Admission  Medication Sig Dispense Refill Last Dose  . ibuprofen (ADVIL,MOTRIN) 600 MG tablet Take 1 tablet (600 mg total) by mouth every 6 (six) hours as needed. (Patient taking differently: Take 600 mg by mouth every 6 (six) hours as needed (for pain.). ) 30 tablet 2 Past  Month at Unknown time  . famotidine (PEPCID) 20 MG tablet Take 1 tablet (20 mg total) by mouth 2 (two) times daily. (Patient taking differently: Take 20 mg by mouth 2 (two) times daily as needed (for heartburn/indigestion.). ) 30 tablet 0 More than a month at Unknown time    ROS  Blood pressure 117/80, pulse 64, temperature 98 F (36.7 C), temperature source Oral, resp. rate 16, height 5\' 5"  (1.651 m), weight 158 lb (71.7 kg), last menstrual period 02/24/2017, SpO2 100 %, unknown if currently breastfeeding. Physical Exam  Results for orders placed or performed during the hospital encounter of 03/23/17 (from the past 24 hour(s))  CBC     Status: None   Collection Time: 03/23/17  6:00 AM  Result Value Ref Range   WBC 6.0 4.0 - 10.5 K/uL   RBC 4.41 3.87 - 5.11 MIL/uL   Hemoglobin 14.3 12.0 - 15.0 g/dL   HCT 43.0 36.0 - 46.0 %   MCV 97.5 78.0 - 100.0 fL   MCH 32.4 26.0 - 34.0 pg   MCHC 33.3 30.0 - 36.0 g/dL   RDW 13.9 11.5 - 15.5 %   Platelets 240 150 - 400 K/uL  Pregnancy, urine     Status: None   Collection Time: 03/23/17  6:00  AM  Result Value Ref Range   Preg Test, Ur NEGATIVE NEGATIVE    Dg Chest 2 View  Result Date: 03/22/2017 CLINICAL DATA:  Cough. EXAM: CHEST  2 VIEW COMPARISON:  Radiograph 10/07/2013 FINDINGS: The cardiomediastinal contours are normal. Mild central bronchial thickening. Pulmonary vasculature is normal. No consolidation, pleural effusion, or pneumothorax. No acute osseous abnormalities are seen. IMPRESSION: Mild central bronchial thickening, can be seen with asthma or bronchitis. No pneumonia. Electronically Signed   By: Jeb Levering M.D.   On: 03/22/2017 01:16    Assessment/Plan: P2 with CIS possible microinvasive disease presenting for cold knife cone.  R/B/A discussed and consent s/w.  Pt cleared yesterday by anesthesia after cxray for surgery due to some sxs of bronchitis.  Questions answered.  Delice Lesch 03/23/2017, 8:16 AM

## 2017-03-23 NOTE — Anesthesia Procedure Notes (Signed)
Procedure Name: LMA Insertion Date/Time: 03/23/2017 8:36 AM Performed by: Hewitt Blade, CRNA Pre-anesthesia Checklist: Patient identified, Emergency Drugs available, Suction available and Patient being monitored Patient Re-evaluated:Patient Re-evaluated prior to induction Oxygen Delivery Method: Circle system utilized Preoxygenation: Pre-oxygenation with 100% oxygen Induction Type: IV induction Ventilation: Mask ventilation without difficulty LMA: LMA inserted LMA Size: 3.0 Number of attempts: 1 Placement Confirmation: positive ETCO2 and breath sounds checked- equal and bilateral Tube secured with: Tape Dental Injury: Teeth and Oropharynx as per pre-operative assessment

## 2017-03-23 NOTE — Anesthesia Preprocedure Evaluation (Signed)
Anesthesia Evaluation  Patient identified by MRN, date of birth, ID band Patient awake    Reviewed: Allergy & Precautions, NPO status , Patient's Chart, lab work & pertinent test results  Airway Mallampati: II  TM Distance: >3 FB     Dental   Pulmonary neg pulmonary ROS, Current Smoker,    breath sounds clear to auscultation       Cardiovascular negative cardio ROS   Rhythm:Regular Rate:Normal     Neuro/Psych    GI/Hepatic Neg liver ROS, GERD  ,  Endo/Other  negative endocrine ROSHypothyroidism   Renal/GU negative Renal ROS     Musculoskeletal   Abdominal   Peds  Hematology   Anesthesia Other Findings   Reproductive/Obstetrics                             Anesthesia Physical Anesthesia Plan  ASA: II  Anesthesia Plan: General   Post-op Pain Management:    Induction: Intravenous  PONV Risk Score and Plan: 2 and Treatment may vary due to age or medical condition  Airway Management Planned: LMA  Additional Equipment:   Intra-op Plan:   Post-operative Plan: Extubation in OR  Informed Consent: I have reviewed the patients History and Physical, chart, labs and discussed the procedure including the risks, benefits and alternatives for the proposed anesthesia with the patient or authorized representative who has indicated his/her understanding and acceptance.   Dental advisory given  Plan Discussed with: CRNA and Anesthesiologist  Anesthesia Plan Comments:         Anesthesia Quick Evaluation

## 2017-03-23 NOTE — Anesthesia Postprocedure Evaluation (Signed)
Anesthesia Post Note  Patient: [place]  Procedure(s) Performed: CONIZATION CERVIX WITH BIOPSY (N/A Cervix) INTRAUTERINE DEVICE (IUD) REMOVAL (N/A Vagina )     Patient location during evaluation: PACU Anesthesia Type: General Level of consciousness: awake Pain management: pain level controlled Vital Signs Assessment: post-procedure vital signs reviewed and stable Respiratory status: spontaneous breathing Cardiovascular status: stable Anesthetic complications: no    Last Vitals:  Vitals:   03/23/17 1015 03/23/17 1030  BP:  115/86  Pulse: (!) 49 (!) 55  Resp: 13 15  Temp:    SpO2: 97% 98%    Last Pain:  Vitals:   03/23/17 1030  TempSrc:   PainSc: 3    Pain Goal: Patients Stated Pain Goal: 2 (03/23/17 1030)               Rayhan Groleau

## 2017-03-23 NOTE — Transfer of Care (Signed)
Immediate Anesthesia Transfer of Care Note  Patient: Sara Shannon  Procedure(s) Performed: CONIZATION CERVIX WITH BIOPSY (N/A Cervix) INTRAUTERINE DEVICE (IUD) REMOVAL (N/A Vagina )  Patient Location: PACU  Anesthesia Type:General  Level of Consciousness: awake, alert  and oriented  Airway & Oxygen Therapy: Patient Spontanous Breathing and Patient connected to nasal cannula oxygen  Post-op Assessment: Report given to RN, Post -op Vital signs reviewed and stable and Patient moving all extremities  Post vital signs: Reviewed and stable  Last Vitals:  Vitals:   03/23/17 0723  BP: 117/80  Pulse: 64  Resp: 16  Temp: 36.7 C  SpO2: 100%    Last Pain:  Vitals:   03/23/17 0723  TempSrc: Oral      Patients Stated Pain Goal: 2 (20/81/38 8719)  Complications: No apparent anesthesia complications

## 2017-03-24 ENCOUNTER — Encounter (HOSPITAL_COMMUNITY): Payer: Self-pay | Admitting: Obstetrics and Gynecology

## 2017-04-01 DIAGNOSIS — J40 Bronchitis, not specified as acute or chronic: Secondary | ICD-10-CM | POA: Diagnosis not present

## 2017-04-01 DIAGNOSIS — N760 Acute vaginitis: Secondary | ICD-10-CM | POA: Diagnosis not present

## 2017-04-01 DIAGNOSIS — N898 Other specified noninflammatory disorders of vagina: Secondary | ICD-10-CM | POA: Diagnosis not present

## 2017-04-01 DIAGNOSIS — B373 Candidiasis of vulva and vagina: Secondary | ICD-10-CM | POA: Diagnosis not present

## 2017-04-07 ENCOUNTER — Ambulatory Visit: Payer: 59 | Attending: Gynecologic Oncology | Admitting: Gynecologic Oncology

## 2017-04-07 ENCOUNTER — Encounter: Payer: Self-pay | Admitting: Gynecologic Oncology

## 2017-04-07 VITALS — BP 128/79 | HR 54 | Temp 98.0°F | Resp 18 | Wt 157.0 lb

## 2017-04-07 DIAGNOSIS — F17210 Nicotine dependence, cigarettes, uncomplicated: Secondary | ICD-10-CM | POA: Diagnosis not present

## 2017-04-07 DIAGNOSIS — C539 Malignant neoplasm of cervix uteri, unspecified: Secondary | ICD-10-CM | POA: Insufficient documentation

## 2017-04-07 DIAGNOSIS — Z79899 Other long term (current) drug therapy: Secondary | ICD-10-CM | POA: Insufficient documentation

## 2017-04-07 DIAGNOSIS — Z9889 Other specified postprocedural states: Secondary | ICD-10-CM | POA: Diagnosis not present

## 2017-04-07 DIAGNOSIS — F17218 Nicotine dependence, cigarettes, with other nicotine-induced disorders: Secondary | ICD-10-CM

## 2017-04-07 DIAGNOSIS — D069 Carcinoma in situ of cervix, unspecified: Secondary | ICD-10-CM | POA: Diagnosis not present
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
## 2017-04-07 NOTE — Patient Instructions (Signed)
Preparing for your Surgery  Plan for surgery on February 5 with Dr. Everitt Amber at [facility] will be scheduled for a robotic assisted total hysterectomy, bilateral salpingectomy, sentinel lymph node biopsy.  Pre-operative Testing -You will receive a phone call from presurgical testing at [facility] to arrange for a pre-operative testing appointment before your surgery.  This appointment normally occurs one to two weeks before your scheduled surgery.   -Bring your insurance card, copy of an advanced directive if applicable, medication list  -At that visit, you will be asked to sign a consent for a possible blood transfusion in case a transfusion becomes necessary during surgery.  The need for a blood transfusion is rare but having consent is a necessary part of your care.     -You should not be taking blood thinners or aspirin at least ten days prior to surgery unless instructed by your surgeon.  Day Before Surgery at [facility] will be asked to take in a light diet the day before surgery.  Avoid carbonated beverages.  You will be advised to have nothing to eat or drink after midnight the evening before.    Eat a light diet the day before surgery.  Examples including soups, broths, toast, yogurt, mashed potatoes.  Things to avoid include carbonated beverages (fizzy beverages), raw fruits and raw vegetables, or beans.   If your bowels are filled with gas, your surgeon will have difficulty visualizing your pelvic organs which increases your surgical risks.  Your role in recovery Your role is to become active as soon as directed by your doctor, while still giving yourself time to heal.  Rest when you feel tired. You will be asked to do the following in order to speed your recovery:  - Cough and breathe deeply. This helps toclear and expand your lungs and can prevent pneumonia. You may be given a spirometer to practice deep breathing. A staff member will show  you how to use the spirometer. - Do mild physical activity. Walking or moving your legs help your circulation and body functions return to normal. A staff member will help you when you try to walk and will provide you with simple exercises. Do not try to get up or walk alone the first time. - Actively manage your pain. Managing your pain lets you move in comfort. We will ask you to rate your pain on a scale of zero to 10. It is your responsibility to tell your doctor or nurse where and how much you hurt so your pain can be treated.  Special Considerations -If you are diabetic, you may be placed on insulin after surgery to have closer control over your blood sugars to promote healing and recovery.  This does not mean that you will be discharged on insulin.  If applicable, your oral antidiabetics will be resumed when you are tolerating a solid diet.  -Your final pathology results from surgery should be available by the Friday after surgery and the results will be relayed to you when available.  -Dr. Lahoma Crocker is the Surgeon that assists your GYN Oncologist with surgery.  The next day after your surgery you will either see your GYN Oncologist or Dr. Lahoma Crocker.   Blood Transfusion Information WHAT IS A BLOOD TRANSFUSION? A transfusion is the replacement of blood or some of its parts. Blood is made up of multiple cells which provide different functions.  Red blood cells carry oxygen and are used for blood loss  replacement.  White blood cells fight against infection.  Platelets control bleeding.  Plasma helps clot blood.  Other blood products are available for specialized needs, such as hemophilia or other clotting disorders. BEFORE THE TRANSFUSION  Who gives blood for transfusions?   You may be able to donate blood to be used at a later date on yourself (autologous donation).  Relatives can be asked to donate blood. This is generally not any safer than if you have received  blood from a stranger. The same precautions are taken to ensure safety when a relative's blood is donated.  Healthy volunteers who are fully evaluated to make sure their blood is safe. This is blood bank blood. Transfusion therapy is the safest it has ever been in the practice of medicine. Before blood is taken from a donor, a complete history is taken to make sure that person has no history of diseases nor engages in risky social behavior (examples are intravenous drug use or sexual activity with multiple partners). The donor's travel history is screened to minimize risk of transmitting infections, such as malaria. The donated blood is tested for signs of infectious diseases, such as HIV and hepatitis. The blood is then tested to be sure it is compatible with you in order to minimize the chance of a transfusion reaction. If you or a relative donates blood, this is often done in anticipation of surgery and is not appropriate for emergency situations. It takes many days to process the donated blood. RISKS AND COMPLICATIONS Although transfusion therapy is very safe and saves many lives, the main dangers of transfusion include:   Getting an infectious disease.  Developing a transfusion reaction. This is an allergic reaction to something in the blood you were given. Every precaution is taken to prevent this. The decision to have a blood transfusion has been considered carefully by your caregiver before blood is given. Blood is not given unless the benefits outweigh the risks.

## 2017-04-12 ENCOUNTER — Telehealth: Payer: Self-pay | Admitting: Gynecologic Oncology

## 2017-04-12 NOTE — Telephone Encounter (Signed)
Returned call to patient.  All questions answered in regards to upcoming surgery.  Patient asking about receiving gardisil injections around surgery.  Advised to call for any additional questions.

## 2017-04-13 DIAGNOSIS — R5383 Other fatigue: Secondary | ICD-10-CM | POA: Diagnosis not present

## 2017-04-29 NOTE — Patient Instructions (Signed)
CORISA MONTINI  04/29/2017   Your procedure is scheduled on: 05/11/2017   Report to [facility] Main  Entrance  Report to admitting at  12 noon   Call this number if you have problems the morning of surgery [phone]   Remember: Do not eat food  :After Midnight.Eat a light diet the day before surgery.  Examples include: soups, toast, broth, yogurt and mashed potatoes.  Things to avoid include: carbonated beverages, raw fruits and vegetables and beans.      Take these medicines the morning of surgery with A SIP OF WATER: Inhalers as usual and bring, Pepcid, Vicodin if needed                                 You may not have any metal on your body including hair pins and              piercings  Do not wear jewelry, make-up, lotions, powders or perfumes, deodorant             Do not wear nail polish.  Do not shave  48 hours prior to surgery.                Do not bring valuables to the hospital. [place].  Contacts, dentures or bridgework may not be worn into surgery.  Leave suitcase in the car. After surgery it may be brought to your room.       Special Instructions: coughing and deep breathing exercises, leg exercises              Please read over the following fact sheets you were given: _____________________________________________________________________             [facility] - Preparing for Surgery Before surgery, you can play an important role.  Because skin is not sterile, your skin needs to be as free of germs as possible.  You can reduce the number of germs on your skin by washing with CHG (chlorahexidine gluconate) soap before surgery.  CHG is an antiseptic cleaner which kills germs and bonds with the skin to continue killing germs even after washing. Please DO NOT use if you have an allergy to CHG or antibacterial soaps.  If your skin becomes reddened/irritated stop using the CHG and inform  your nurse when you arrive at Short Stay. Do not shave (including legs and underarms) for at least 48 hours prior to the first CHG shower.  You may shave your face/neck. Please follow these instructions carefully:  1.  Shower with CHG Soap the night before surgery and the  morning of Surgery.  2.  If you choose to wash your hair, wash your hair first as usual with your  normal  shampoo.  3.  After you shampoo, rinse your hair and body thoroughly to remove the  shampoo.                           4.  Use CHG as you would any other liquid soap.  You can apply chg directly  to the skin and wash  Gently with a scrungie or clean washcloth.  5.  Apply the CHG Soap to your body ONLY FROM THE NECK DOWN.   Do not use on face/ open                           Wound or open sores. Avoid contact with eyes, ears mouth and genitals (private parts).                       Wash face,  Genitals (private parts) with your normal soap.             6.  Wash thoroughly, paying special attention to the area where your surgery  will be performed.  7.  Thoroughly rinse your body with warm water from the neck down.  8.  DO NOT shower/wash with your normal soap after using and rinsing off  the CHG Soap.                9.  Pat yourself dry with a clean towel.            10.  Wear clean pajamas.            11.  Place clean sheets on your bed the night of your first shower and do not  sleep with pets. Day of Surgery : Do not apply any lotions/deodorants the morning of surgery.  Please wear clean clothes to the hospital/surgery center.  FAILURE TO FOLLOW THESE INSTRUCTIONS MAY RESULT IN THE CANCELLATION OF YOUR SURGERY PATIENT SIGNATURE_________________________________  NURSE SIGNATURE__________________________________  ________________________________________________________________________  WHAT IS A BLOOD TRANSFUSION? Blood Transfusion Information  A transfusion is the replacement of blood or some  of its parts. Blood is made up of multiple cells which provide different functions.  Red blood cells carry oxygen and are used for blood loss replacement.  White blood cells fight against infection.  Platelets control bleeding.  Plasma helps clot blood.  Other blood products are available for specialized needs, such as hemophilia or other clotting disorders. BEFORE THE TRANSFUSION  Who gives blood for transfusions?   Healthy volunteers who are fully evaluated to make sure their blood is safe. This is blood bank blood. Transfusion therapy is the safest it has ever been in the practice of medicine. Before blood is taken from a donor, a complete history is taken to make sure that person has no history of diseases nor engages in risky social behavior (examples are intravenous drug use or sexual activity with multiple partners). The donor's travel history is screened to minimize risk of transmitting infections, such as malaria. The donated blood is tested for signs of infectious diseases, such as HIV and hepatitis. The blood is then tested to be sure it is compatible with you in order to minimize the chance of a transfusion reaction. If you or a relative donates blood, this is often done in anticipation of surgery and is not appropriate for emergency situations. It takes many days to process the donated blood. RISKS AND COMPLICATIONS Although transfusion therapy is very safe and saves many lives, the main dangers of transfusion include:   Getting an infectious disease.  Developing a transfusion reaction. This is an allergic reaction to something in the blood you were given. Every precaution is taken to prevent this. The decision to have a blood transfusion has been considered carefully by your caregiver before blood is given. Blood is not given unless the benefits outweigh  the risks. AFTER THE TRANSFUSION  Right after receiving a blood transfusion, you will usually feel much better and more  energetic. This is especially true if your red blood cells have gotten low (anemic). The transfusion raises the level of the red blood cells which carry oxygen, and this usually causes an energy increase.  The nurse administering the transfusion will monitor you carefully for complications. HOME CARE INSTRUCTIONS  No special instructions are needed after a transfusion. You may find your energy is better. Speak with your caregiver about any limitations on activity for underlying diseases you may have. SEEK MEDICAL CARE IF:   Your condition is not improving after your transfusion.  You develop redness or irritation at the intravenous (IV) site. SEEK IMMEDIATE MEDICAL CARE IF:  Any of the following symptoms occur over the next 12 hours:  Shaking chills.  You have a temperature by mouth above 102 F (38.9 C), not controlled by medicine.  Chest, back, or muscle pain.  People around you feel you are not acting correctly or are confused.  Shortness of breath or difficulty breathing.  Dizziness and fainting.  You get a rash or develop hives.  You have a decrease in urine output.  Your urine turns a dark color or changes to pink, red, or brown. Any of the following symptoms occur over the next 10 days:  You have a temperature by mouth above 102 F (38.9 C), not controlled by medicine.  Shortness of breath.  Weakness after normal activity.  The white part of the eye turns yellow (jaundice).  You have a decrease in the amount of urine or are urinating less often.  Your urine turns a dark color or changes to pink, red, or brown. Document Released: 03/20/2000 Document Revised: 06/15/2011 Document Reviewed: 11/07/2007 ExitCare Patient Information 2014 [place].  _______________________________________________________________________  Incentive Spirometer  An incentive spirometer is a tool that can help keep your lungs clear and active. This tool measures how well you are  filling your lungs with each breath. Taking long deep breaths may help reverse or decrease the chance of developing breathing (pulmonary) problems (especially infection) following:  A long period of time when you are unable to move or be active. BEFORE THE PROCEDURE   If the spirometer includes an indicator to show your best effort, your nurse or respiratory therapist will set it to a desired goal.  If possible, sit up straight or lean slightly forward. Try not to slouch.  Hold the incentive spirometer in an upright position. INSTRUCTIONS FOR USE  1. Sit on the edge of your bed if possible, or sit up as far as you can in bed or on a chair. 2. Hold the incentive spirometer in an upright position. 3. Breathe out normally. 4. Place the mouthpiece in your mouth and seal your lips tightly around it. 5. Breathe in slowly and as deeply as possible, raising the piston or the ball toward the top of the column. 6. Hold your breath for 3-5 seconds or for as long as possible. Allow the piston or ball to fall to the bottom of the column. 7. Remove the mouthpiece from your mouth and breathe out normally. 8. Rest for a few seconds and repeat Steps 1 through 7 at least 10 times every 1-2 hours when you are awake. Take your time and take a few normal breaths between deep breaths. 9. The spirometer may include an indicator to show your best effort. Use the indicator as a goal to work  toward during each repetition. 10. After each set of 10 deep breaths, practice coughing to be sure your lungs are clear. If you have an incision (the cut made at the time of surgery), support your incision when coughing by placing a pillow or rolled up towels firmly against it. Once you are able to get out of bed, walk around indoors and cough well. You may stop using the incentive spirometer when instructed by your caregiver.  RISKS AND COMPLICATIONS  Take your time so you do not get dizzy or light-headed.  If you are in pain,  you may need to take or ask for pain medication before doing incentive spirometry. It is harder to take a deep breath if you are having pain. AFTER USE  Rest and breathe slowly and easily.  It can be helpful to keep track of a log of your progress. Your caregiver can provide you with a simple table to help with this. If you are using the spirometer at home, follow these instructions: [place] IF:   You are having difficultly using the spirometer.  You have trouble using the spirometer as often as instructed.  Your pain medication is not giving enough relief while using the spirometer.  You develop fever of 100.5 F (38.1 C) or higher. SEEK IMMEDIATE MEDICAL CARE IF:   You cough up bloody sputum that had not been present before.  You develop fever of 102 F (38.9 C) or greater.  You develop worsening pain at or near the incision site. MAKE SURE YOU:   Understand these instructions.  Will watch your condition.  Will get help right away if you are not doing well or get worse. Document Released: 08/03/2006 Document Revised: 06/15/2011 Document Reviewed: 10/04/2006 ExitCare Patient Information 2014 ExitCare, [place].   ________________________________________________________________________    CLEAR LIQUID DIET   Foods Allowed                                                                     Foods Excluded  Coffee and tea, regular and decaf                             liquids that you cannot  Plain Jell-O in any flavor                                             see through such as: Fruit ices (not with fruit pulp)                                     milk, soups, orange juice  Iced Popsicles                                    All solid food  Cranberry, grape and apple juices Sports drinks like Gatorade Lightly seasoned clear broth or consume(fat free) Sugar, honey syrup  Sample Menu Breakfast                                 Lunch                                     Supper Cranberry juice                    Beef broth                            Chicken broth Jell-O                                     Grape juice                           Apple juice Coffee or tea                        Jell-O                                      Popsicle                                                Coffee or tea                        Coffee or tea  _____________________________________________________________________

## 2017-05-03 ENCOUNTER — Encounter (HOSPITAL_COMMUNITY): Payer: Self-pay

## 2017-05-03 ENCOUNTER — Encounter (HOSPITAL_COMMUNITY)
Admission: RE | Admit: 2017-05-03 | Discharge: 2017-05-03 | Disposition: A | Discharge status: Home / Self Care | Payer: 59 | Source: Ambulatory Visit | Attending: Gynecologic Oncology | Admitting: Gynecologic Oncology

## 2017-05-03 ENCOUNTER — Ambulatory Visit (HOSPITAL_COMMUNITY)
Admission: RE | Admit: 2017-05-03 | Discharge: 2017-05-03 | Disposition: A | Discharge status: Home / Self Care | Payer: 59 | Source: Ambulatory Visit | Attending: Gynecologic Oncology | Admitting: Gynecologic Oncology

## 2017-05-03 ENCOUNTER — Other Ambulatory Visit: Payer: Self-pay

## 2017-05-03 DIAGNOSIS — F17200 Nicotine dependence, unspecified, uncomplicated: Secondary | ICD-10-CM | POA: Diagnosis not present

## 2017-05-03 DIAGNOSIS — Z01812 Encounter for preprocedural laboratory examination: Secondary | ICD-10-CM | POA: Insufficient documentation

## 2017-05-03 DIAGNOSIS — Z0183 Encounter for blood typing: Secondary | ICD-10-CM | POA: Diagnosis not present

## 2017-05-03 DIAGNOSIS — Z01818 Encounter for other preprocedural examination: Secondary | ICD-10-CM | POA: Diagnosis not present

## 2017-05-03 DIAGNOSIS — R05 Cough: Secondary | ICD-10-CM | POA: Diagnosis not present

## 2017-05-03 DIAGNOSIS — C539 Malignant neoplasm of cervix uteri, unspecified: Secondary | ICD-10-CM | POA: Insufficient documentation

## 2017-05-03 DIAGNOSIS — R918 Other nonspecific abnormal finding of lung field: Secondary | ICD-10-CM | POA: Insufficient documentation

## 2017-05-03 HISTORY — DX: Depression, unspecified: F32.A

## 2017-05-03 HISTORY — DX: Bronchitis, not specified as acute or chronic: J40

## 2017-05-03 HISTORY — DX: Major depressive disorder, single episode, unspecified: F32.9

## 2017-05-03 LAB — URINALYSIS, ROUTINE W REFLEX MICROSCOPIC
BILIRUBIN URINE: NEGATIVE
GLUCOSE, UA: NEGATIVE mg/dL
Hgb urine dipstick: NEGATIVE
KETONES UR: NEGATIVE mg/dL
Leukocytes, UA: NEGATIVE
NITRITE: NEGATIVE
PH: 5 (ref 5.0–8.0)
PROTEIN: NEGATIVE mg/dL
Specific Gravity, Urine: 1.012 (ref 1.005–1.030)

## 2017-05-03 LAB — COMPREHENSIVE METABOLIC PANEL
ALBUMIN: 4.4 g/dL (ref 3.5–5.0)
ALT: 13 U/L — ABNORMAL LOW (ref 14–54)
ANION GAP: 6 (ref 5–15)
AST: 14 U/L — ABNORMAL LOW (ref 15–41)
Alkaline Phosphatase: 54 U/L (ref 38–126)
BILIRUBIN TOTAL: 0.6 mg/dL (ref 0.3–1.2)
BUN: 7 mg/dL (ref 6–20)
CHLORIDE: 106 mmol/L (ref 101–111)
CO2: 26 mmol/L (ref 22–32)
Calcium: 8.8 mg/dL — ABNORMAL LOW (ref 8.9–10.3)
Creatinine, Ser: 0.8 mg/dL (ref 0.44–1.00)
GFR calc Af Amer: >60 mL/min (ref >60)
GFR calc non Af Amer: >60 mL/min (ref >60)
GLUCOSE: 84 mg/dL (ref 65–99)
POTASSIUM: 4 mmol/L (ref 3.5–5.1)
SODIUM: 138 mmol/L (ref 135–145)
TOTAL PROTEIN: 7.4 g/dL (ref 6.5–8.1)

## 2017-05-03 LAB — CBC
HEMATOCRIT: 40.3 % (ref 36.0–46.0)
Hemoglobin: 14 g/dL (ref 12.0–15.0)
MCH: 32.7 pg (ref 26.0–34.0)
MCHC: 34.7 g/dL (ref 30.0–36.0)
MCV: 94.2 fL (ref 78.0–100.0)
PLATELETS: 242 10*3/uL (ref 150–400)
RBC: 4.28 MIL/uL (ref 3.87–5.11)
RDW: 14.1 % (ref 11.5–15.5)
WBC: 7.2 10*3/uL (ref 4.0–10.5)

## 2017-05-03 LAB — PREGNANCY, URINE: Preg Test, Ur: NEGATIVE

## 2017-05-03 LAB — ABO/RH: ABO/RH(D): A POS

## 2017-05-04 ENCOUNTER — Telehealth: Payer: 59 | Admitting: Family

## 2017-05-04 DIAGNOSIS — B9689 Other specified bacterial agents as the cause of diseases classified elsewhere: Secondary | ICD-10-CM | POA: Diagnosis not present

## 2017-05-04 DIAGNOSIS — J019 Acute sinusitis, unspecified: Secondary | ICD-10-CM

## 2017-05-04 MED ORDER — AMOXICILLIN-POT CLAVULANATE 875-125 MG PO TABS: 1.0000 | ORAL_TABLET | Freq: Two times a day (BID) | ORAL | 0 refills | Status: DC

## 2017-05-04 NOTE — Progress Notes (Signed)

## 2017-05-07 DIAGNOSIS — Z09 Encounter for follow-up examination after completed treatment for conditions other than malignant neoplasm: Secondary | ICD-10-CM | POA: Diagnosis not present

## 2017-05-11 ENCOUNTER — Ambulatory Visit (HOSPITAL_COMMUNITY): Payer: 59 | Admitting: Certified Registered Nurse Anesthetist

## 2017-05-11 ENCOUNTER — Encounter (HOSPITAL_COMMUNITY): Payer: Self-pay | Admitting: *Deleted

## 2017-05-11 ENCOUNTER — Other Ambulatory Visit: Payer: Self-pay

## 2017-05-11 ENCOUNTER — Ambulatory Visit (HOSPITAL_COMMUNITY)
Admission: RE | Admit: 2017-05-11 | Discharge: 2017-05-12 | Disposition: A | Discharge status: Home / Self Care | Payer: 59 | Source: Ambulatory Visit | Attending: Gynecologic Oncology | Admitting: Gynecologic Oncology

## 2017-05-11 ENCOUNTER — Encounter (HOSPITAL_COMMUNITY)
Admission: RE | Disposition: A | Discharge status: Home / Self Care | Payer: Self-pay | Source: Ambulatory Visit | Attending: Gynecologic Oncology

## 2017-05-11 DIAGNOSIS — F17210 Nicotine dependence, cigarettes, uncomplicated: Secondary | ICD-10-CM | POA: Insufficient documentation

## 2017-05-11 DIAGNOSIS — Z79899 Other long term (current) drug therapy: Secondary | ICD-10-CM | POA: Diagnosis not present

## 2017-05-11 DIAGNOSIS — F329 Major depressive disorder, single episode, unspecified: Secondary | ICD-10-CM | POA: Diagnosis not present

## 2017-05-11 DIAGNOSIS — N870 Mild cervical dysplasia: Secondary | ICD-10-CM | POA: Diagnosis not present

## 2017-05-11 DIAGNOSIS — F419 Anxiety disorder, unspecified: Secondary | ICD-10-CM | POA: Diagnosis not present

## 2017-05-11 DIAGNOSIS — K219 Gastro-esophageal reflux disease without esophagitis: Secondary | ICD-10-CM | POA: Insufficient documentation

## 2017-05-11 DIAGNOSIS — C539 Malignant neoplasm of cervix uteri, unspecified: Secondary | ICD-10-CM | POA: Diagnosis not present

## 2017-05-11 DIAGNOSIS — E039 Hypothyroidism, unspecified: Secondary | ICD-10-CM | POA: Diagnosis not present

## 2017-05-11 DIAGNOSIS — D259 Leiomyoma of uterus, unspecified: Secondary | ICD-10-CM | POA: Insufficient documentation

## 2017-05-11 HISTORY — PX: SENTINEL NODE BIOPSY: SHX6608

## 2017-05-11 HISTORY — PX: ROBOTIC ASSISTED TOTAL HYSTERECTOMY: SHX6085

## 2017-05-11 HISTORY — PX: BILATERAL SALPINGECTOMY: SHX5743

## 2017-05-11 LAB — TYPE AND SCREEN
ABO/RH(D): A POS
ANTIBODY SCREEN: NEGATIVE

## 2017-05-11 SURGERY — ROBOTIC ASSISTED TOTAL HYSTERECTOMY
Anesthesia: General

## 2017-05-11 MED ORDER — DEXAMETHASONE SODIUM PHOSPHATE 4 MG/ML IJ SOLN
4.0000 mg | INTRAMUSCULAR | Status: AC
  Administered 2017-05-11: 4 mg via INTRAVENOUS

## 2017-05-11 MED ORDER — HYDROMORPHONE HCL 1 MG/ML IJ SOLN
INTRAMUSCULAR | Status: AC
  Administered 2017-05-11: 0.25 mg via INTRAVENOUS
  Filled 2017-05-11: qty 1

## 2017-05-11 MED ORDER — MIDAZOLAM HCL 2 MG/2ML IJ SOLN
INTRAMUSCULAR | Status: AC
  Filled 2017-05-11: qty 2

## 2017-05-11 MED ORDER — ACETAMINOPHEN 500 MG PO TABS
1000.0000 mg | ORAL_TABLET | ORAL | Status: AC
  Administered 2017-05-11: 1000 mg via ORAL
  Filled 2017-05-11: qty 2

## 2017-05-11 MED ORDER — SENNOSIDES-DOCUSATE SODIUM 8.6-50 MG PO TABS
2.0000 | ORAL_TABLET | Freq: Every day | ORAL | Status: DC
  Administered 2017-05-11: 2 via ORAL
  Filled 2017-05-11: qty 2

## 2017-05-11 MED ORDER — SERTRALINE HCL 25 MG PO TABS
25.0000 mg | ORAL_TABLET | Freq: Every day | ORAL | Status: DC
  Administered 2017-05-12: 25 mg via ORAL
  Filled 2017-05-11: qty 1

## 2017-05-11 MED ORDER — ONDANSETRON HCL 4 MG PO TABS: 4.0000 mg | ORAL_TABLET | Freq: Four times a day (QID) | ORAL | Status: DC | PRN

## 2017-05-11 MED ORDER — PROPOFOL 10 MG/ML IV BOLUS
INTRAVENOUS | Status: DC | PRN
  Administered 2017-05-11: 150 mg via INTRAVENOUS

## 2017-05-11 MED ORDER — GABAPENTIN 300 MG PO CAPS
300.0000 mg | ORAL_CAPSULE | ORAL | Status: AC
  Administered 2017-05-11: 300 mg via ORAL
  Filled 2017-05-11: qty 1

## 2017-05-11 MED ORDER — FENTANYL CITRATE (PF) 100 MCG/2ML IJ SOLN
INTRAMUSCULAR | Status: AC
  Administered 2017-05-11: 25 ug via INTRAVENOUS
  Filled 2017-05-11: qty 2

## 2017-05-11 MED ORDER — KETOROLAC TROMETHAMINE 30 MG/ML IJ SOLN: 15.0000 mg | Freq: Four times a day (QID) | INTRAMUSCULAR | Status: DC

## 2017-05-11 MED ORDER — MIDAZOLAM HCL 5 MG/5ML IJ SOLN
INTRAMUSCULAR | Status: DC | PRN
  Administered 2017-05-11: 2 mg via INTRAVENOUS

## 2017-05-11 MED ORDER — PHENYLEPHRINE 40 MCG/ML (10ML) SYRINGE FOR IV PUSH (FOR BLOOD PRESSURE SUPPORT)
PREFILLED_SYRINGE | INTRAVENOUS | Status: AC
  Filled 2017-05-11: qty 10

## 2017-05-11 MED ORDER — ACETAMINOPHEN 500 MG PO TABS
1000.0000 mg | ORAL_TABLET | Freq: Four times a day (QID) | ORAL | Status: DC
  Administered 2017-05-11 – 2017-05-12 (×3): 1000 mg via ORAL
  Filled 2017-05-11 (×3): qty 2

## 2017-05-11 MED ORDER — GABAPENTIN 600 MG PO TABS
600.0000 mg | ORAL_TABLET | Freq: Every day | ORAL | Status: DC
  Filled 2017-05-11: qty 1

## 2017-05-11 MED ORDER — PROPOFOL 10 MG/ML IV BOLUS
INTRAVENOUS | Status: AC
  Filled 2017-05-11: qty 20

## 2017-05-11 MED ORDER — INDOCYANINE GREEN 25 MG IV SOLR
INTRAVENOUS | Status: DC | PRN
  Administered 2017-05-11: 2.5 mg

## 2017-05-11 MED ORDER — ALBUTEROL SULFATE (2.5 MG/3ML) 0.083% IN NEBU: 2.5000 mg | INHALATION_SOLUTION | Freq: Four times a day (QID) | RESPIRATORY_TRACT | Status: DC | PRN

## 2017-05-11 MED ORDER — SUGAMMADEX SODIUM 200 MG/2ML IV SOLN
INTRAVENOUS | Status: AC
  Filled 2017-05-11: qty 2

## 2017-05-11 MED ORDER — OXYCODONE HCL 5 MG PO TABS
5.0000 mg | ORAL_TABLET | ORAL | Status: DC | PRN
  Administered 2017-05-11 – 2017-05-12 (×4): 5 mg via ORAL
  Filled 2017-05-11 (×4): qty 1

## 2017-05-11 MED ORDER — GABAPENTIN 300 MG PO CAPS
600.0000 mg | ORAL_CAPSULE | Freq: Every day | ORAL | Status: AC
  Administered 2017-05-11: 21:00:00 600 mg via ORAL
  Filled 2017-05-11: qty 2

## 2017-05-11 MED ORDER — SUGAMMADEX SODIUM 200 MG/2ML IV SOLN
INTRAVENOUS | Status: DC | PRN
  Administered 2017-05-11: 150 mg via INTRAVENOUS

## 2017-05-11 MED ORDER — CEFAZOLIN SODIUM-DEXTROSE 2-4 GM/100ML-% IV SOLN
2.0000 g | INTRAVENOUS | Status: AC
  Administered 2017-05-11: 2 g via INTRAVENOUS
  Filled 2017-05-11: qty 100

## 2017-05-11 MED ORDER — ENOXAPARIN SODIUM 40 MG/0.4ML ~~LOC~~ SOLN
40.0000 mg | SUBCUTANEOUS | Status: AC
  Administered 2017-05-11: 40 mg via SUBCUTANEOUS
  Filled 2017-05-11: qty 0.4

## 2017-05-11 MED ORDER — HYDROMORPHONE HCL 1 MG/ML IJ SOLN
0.2000 mg | INTRAMUSCULAR | Status: DC | PRN
  Administered 2017-05-11: 0.5 mg via INTRAVENOUS
  Filled 2017-05-11: qty 1

## 2017-05-11 MED ORDER — STERILE WATER FOR INJECTION IJ SOLN
INTRAMUSCULAR | Status: DC | PRN
  Administered 2017-05-11: 4 mL

## 2017-05-11 MED ORDER — DEXAMETHASONE SODIUM PHOSPHATE 10 MG/ML IJ SOLN
INTRAMUSCULAR | Status: AC
  Filled 2017-05-11: qty 1

## 2017-05-11 MED ORDER — KETOROLAC TROMETHAMINE 15 MG/ML IJ SOLN
INTRAMUSCULAR | Status: AC
  Administered 2017-05-11: 15 mg
  Filled 2017-05-11: qty 1

## 2017-05-11 MED ORDER — FENTANYL CITRATE (PF) 100 MCG/2ML IJ SOLN
INTRAMUSCULAR | Status: DC | PRN
  Administered 2017-05-11 (×2): 25 ug via INTRAVENOUS
  Administered 2017-05-11 (×3): 50 ug via INTRAVENOUS
  Administered 2017-05-11: 100 ug via INTRAVENOUS
  Administered 2017-05-11 (×2): 25 ug via INTRAVENOUS

## 2017-05-11 MED ORDER — ROCURONIUM BROMIDE 10 MG/ML (PF) SYRINGE
PREFILLED_SYRINGE | INTRAVENOUS | Status: DC | PRN
  Administered 2017-05-11: 10 mg via INTRAVENOUS
  Administered 2017-05-11: 50 mg via INTRAVENOUS

## 2017-05-11 MED ORDER — PROMETHAZINE HCL 25 MG/ML IJ SOLN: 6.2500 mg | INTRAMUSCULAR | Status: DC | PRN

## 2017-05-11 MED ORDER — ONDANSETRON HCL 4 MG/2ML IJ SOLN
INTRAMUSCULAR | Status: DC | PRN
  Administered 2017-05-11: 4 mg via INTRAVENOUS

## 2017-05-11 MED ORDER — HYDROMORPHONE HCL 1 MG/ML IJ SOLN
0.2500 mg | INTRAMUSCULAR | Status: DC | PRN
  Administered 2017-05-11 (×2): 0.25 mg via INTRAVENOUS

## 2017-05-11 MED ORDER — ENOXAPARIN SODIUM 40 MG/0.4ML ~~LOC~~ SOLN
40.0000 mg | SUBCUTANEOUS | Status: DC
  Administered 2017-05-12: 09:00:00 40 mg via SUBCUTANEOUS
  Filled 2017-05-11: qty 0.4

## 2017-05-11 MED ORDER — SCOPOLAMINE 1 MG/3DAYS TD PT72
1.0000 | MEDICATED_PATCH | TRANSDERMAL | Status: DC
  Administered 2017-05-11: 1.5 mg via TRANSDERMAL
  Filled 2017-05-11: qty 1

## 2017-05-11 MED ORDER — LACTATED RINGERS IV SOLN
INTRAVENOUS | Status: DC
  Administered 2017-05-11 (×2): via INTRAVENOUS

## 2017-05-11 MED ORDER — FAMOTIDINE 20 MG PO TABS
20.0000 mg | ORAL_TABLET | Freq: Two times a day (BID) | ORAL | Status: DC
  Administered 2017-05-11 – 2017-05-12 (×2): 20 mg via ORAL
  Filled 2017-05-11 (×2): qty 1

## 2017-05-11 MED ORDER — FENTANYL CITRATE (PF) 250 MCG/5ML IJ SOLN
INTRAMUSCULAR | Status: AC
  Filled 2017-05-11: qty 5

## 2017-05-11 MED ORDER — FENTANYL CITRATE (PF) 100 MCG/2ML IJ SOLN
25.0000 ug | INTRAMUSCULAR | Status: DC | PRN
  Administered 2017-05-11 (×2): 25 ug via INTRAVENOUS

## 2017-05-11 MED ORDER — ONDANSETRON HCL 4 MG/2ML IJ SOLN
INTRAMUSCULAR | Status: AC
  Filled 2017-05-11: qty 2

## 2017-05-11 MED ORDER — FENTANYL CITRATE (PF) 100 MCG/2ML IJ SOLN
INTRAMUSCULAR | Status: AC
  Filled 2017-05-11: qty 2

## 2017-05-11 MED ORDER — LIDOCAINE 2% (20 MG/ML) 5 ML SYRINGE
INTRAMUSCULAR | Status: DC | PRN
  Administered 2017-05-11: 50 mg via INTRAVENOUS

## 2017-05-11 MED ORDER — GLYCOPYRROLATE 0.2 MG/ML IJ SOLN
INTRAMUSCULAR | Status: DC | PRN
  Administered 2017-05-11: 0.2 mg via INTRAVENOUS

## 2017-05-11 MED ORDER — CELECOXIB 200 MG PO CAPS
400.0000 mg | ORAL_CAPSULE | ORAL | Status: AC
  Administered 2017-05-11: 400 mg via ORAL
  Filled 2017-05-11: qty 2

## 2017-05-11 MED ORDER — LACTATED RINGERS IR SOLN
Status: DC | PRN
  Administered 2017-05-11: 2000 mL

## 2017-05-11 MED ORDER — ALBUTEROL SULFATE HFA 108 (90 BASE) MCG/ACT IN AERS: 1.0000 | INHALATION_SPRAY | Freq: Four times a day (QID) | RESPIRATORY_TRACT | Status: DC | PRN

## 2017-05-11 MED ORDER — LIDOCAINE 2% (20 MG/ML) 5 ML SYRINGE
INTRAMUSCULAR | Status: AC
  Filled 2017-05-11: qty 5

## 2017-05-11 MED ORDER — ONDANSETRON HCL 4 MG/2ML IJ SOLN: 4.0000 mg | Freq: Four times a day (QID) | INTRAMUSCULAR | Status: DC | PRN

## 2017-05-11 MED ORDER — KCL IN DEXTROSE-NACL 20-5-0.45 MEQ/L-%-% IV SOLN
INTRAVENOUS | Status: DC
  Filled 2017-05-11: qty 1000

## 2017-05-11 MED ORDER — STERILE WATER FOR INJECTION IJ SOLN
INTRAMUSCULAR | Status: AC
  Filled 2017-05-11: qty 10

## 2017-05-11 SURGICAL SUPPLY — 51 items
ADH SKN CLS APL DERMABOND .7 (GAUZE/BANDAGES/DRESSINGS) ×2
AGENT HMST KT MTR STRL THRMB (HEMOSTASIS)
APL ESCP 34 STRL LF DISP (HEMOSTASIS)
APPLICATOR SURGIFLO ENDO (HEMOSTASIS) IMPLANT
BAG LAPAROSCOPIC 12 15 PORT 16 (BASKET) IMPLANT
BAG RETRIEVAL 12/15 (BASKET)
BAG SPEC RTRVL LRG 6X4 10 (ENDOMECHANICALS)
COVER BACK TABLE 60X90IN (DRAPES) ×3 IMPLANT
COVER TIP SHEARS 8 DVNC (MISCELLANEOUS) ×2 IMPLANT
COVER TIP SHEARS 8MM DA VINCI (MISCELLANEOUS) ×1
DERMABOND ADVANCED (GAUZE/BANDAGES/DRESSINGS) ×1
DERMABOND ADVANCED .7 DNX12 (GAUZE/BANDAGES/DRESSINGS) ×2 IMPLANT
DRAPE ARM DVNC X/XI (DISPOSABLE) ×8 IMPLANT
DRAPE COLUMN DVNC XI (DISPOSABLE) ×2 IMPLANT
DRAPE DA VINCI XI ARM (DISPOSABLE) ×4
DRAPE DA VINCI XI COLUMN (DISPOSABLE) ×1
DRAPE SHEET LG 3/4 BI-LAMINATE (DRAPES) ×3 IMPLANT
DRAPE SURG IRRIG POUCH 19X23 (DRAPES) ×3 IMPLANT
ELECT REM PT RETURN 15FT ADLT (MISCELLANEOUS) ×3 IMPLANT
GLOVE BIO SURGEON STRL SZ 6 (GLOVE) ×12 IMPLANT
GLOVE BIO SURGEON STRL SZ 6.5 (GLOVE) ×6 IMPLANT
GOWN STRL REUS W/ TWL LRG LVL3 (GOWN DISPOSABLE) ×4 IMPLANT
GOWN STRL REUS W/TWL LRG LVL3 (GOWN DISPOSABLE) ×6
HOLDER FOLEY CATH W/STRAP (MISCELLANEOUS) ×3 IMPLANT
IRRIG SUCT STRYKERFLOW 2 WTIP (MISCELLANEOUS) ×6
IRRIGATION SUCT STRKRFLW 2 WTP (MISCELLANEOUS) ×2 IMPLANT
KIT PROCEDURE DA VINCI SI (MISCELLANEOUS) ×1
KIT PROCEDURE DVNC SI (MISCELLANEOUS) IMPLANT
MANIPULATOR UTERINE 4.5 ZUMI (MISCELLANEOUS) ×3 IMPLANT
NDL SPNL 18GX3.5 QUINCKE PK (NEEDLE) ×2 IMPLANT
NEEDLE SPNL 18GX3.5 QUINCKE PK (NEEDLE) ×3 IMPLANT
OBTURATOR OPTICAL STANDARD 8MM (TROCAR) ×1
OBTURATOR OPTICAL STND 8 DVNC (TROCAR) ×2
OBTURATOR OPTICALSTD 8 DVNC (TROCAR) ×2 IMPLANT
PACK ROBOT GYN CUSTOM WL (TRAY / TRAY PROCEDURE) ×3 IMPLANT
PAD POSITIONING PINK XL (MISCELLANEOUS) ×3 IMPLANT
POUCH SPECIMEN RETRIEVAL 10MM (ENDOMECHANICALS) IMPLANT
SEAL CANN UNIV 5-8 DVNC XI (MISCELLANEOUS) ×8 IMPLANT
SEAL XI 5MM-8MM UNIVERSAL (MISCELLANEOUS) ×4
SET TRI-LUMEN FLTR TB AIRSEAL (TUBING) ×3 IMPLANT
SURGIFLO W/THROMBIN 8M KIT (HEMOSTASIS) IMPLANT
SUT VIC AB 0 CT1 27 (SUTURE) ×3
SUT VIC AB 0 CT1 27XBRD ANTBC (SUTURE) IMPLANT
SUT VIC AB 3-0 SH 27 (SUTURE) ×3
SUT VIC AB 3-0 SH 27XBRD (SUTURE) IMPLANT
SYR 10ML LL (SYRINGE) ×3 IMPLANT
TOWEL OR NON WOVEN STRL DISP B (DISPOSABLE) ×3 IMPLANT
TRAP SPECIMEN MUCOUS 40CC (MISCELLANEOUS) IMPLANT
TRAY FOLEY W/METER SILVER 16FR (SET/KITS/TRAYS/PACK) ×3 IMPLANT
UNDERPAD 30X30 (UNDERPADS AND DIAPERS) ×3 IMPLANT
WATER STERILE IRR 1000ML POUR (IV SOLUTION) ×5 IMPLANT

## 2017-05-11 NOTE — Op Note (Addendum)
OPERATIVE NOTE 05/11/17  Surgeon: Donaciano Eva   Assistants: Dr Lahoma Crocker (an MD assistant was necessary for tissue manipulation, management of robotic instrumentation, retraction and positioning due to the complexity of the case and hospital policies).   Anesthesia: General endotracheal anesthesia  ASA Class: 3   Pre-operative Diagnosis: cervical cancer  Post-operative Diagnosis: same,   Operation: Robotic-assisted laparoscopic total hysterectomy with bilateral salpingectomy, SLN biopsy   Surgeon: Donaciano Eva  Assistant Surgeon: Lahoma Crocker MD  Anesthesia: GET  Urine Output: 200cc  Operative Findings:  : 8cm retroverted uterus, normal tubes and ovaries  Estimated Blood Loss:  less than 50 mL      Total IV Fluids: 900 ml         Specimens: uterus, cervix, bilateral tubes, right obturator SLN, left obturator SLN, left external iliac SLN 1 and 2.          Complications:  None; patient tolerated the procedure well.         Disposition: PACU - hemodynamically stable.  Procedure Details  The patient was seen in the Holding Room. The risks, benefits, complications, treatment options, and expected outcomes were discussed with the patient.  The patient concurred with the proposed plan, giving informed consent.  The site of surgery properly noted/marked. The patient was identified as Sara Shannon and the procedure verified as a Robotic-assisted hysterectomy with bilateral salpingo oophorectomy with SLN biopsy. A Time Out was held and the above information confirmed.  After induction of anesthesia, the patient was draped and prepped in the usual sterile manner. Pt was placed in supine position after anesthesia and draped and prepped in the usual sterile manner. The abdominal drape was placed after the CholoraPrep had been allowed to dry for 3 minutes.  Her arms were tucked to her side with all appropriate precautions.  The shoulders were stabilized  with padded shoulder blocks applied to the acromium processes.  The patient was placed in the semi-lithotomy position in [place].  The perineum was prepped with Betadine. The patient was then prepped. Foley catheter was placed.  A sterile speculum was placed in the vagina.  The cervix was grasped with a single-tooth tenaculum. 2mg  total of ICG was injected into the cervical stroma at 2 and 9 o'clock with 1cc injected at a 1cm and 38mm depth (concentration 0.5mg /ml) in all locations. The cervix was dilated with Kennon Rounds dilators.  The ZUMI uterine manipulator with a medium colpotomizer ring was placed without difficulty.  A pneum occluder balloon was placed over the manipulator.  OG tube placement was confirmed and to suction.   Next, a 5 mm skin incision was made 1 cm below the subcostal margin in the midclavicular line.  The 5 mm Optiview port and scope was used for direct entry.  Opening pressure was under 10 mm CO2.  The abdomen was insufflated and the findings were noted as above.   At this point and all points during the procedure, the patient's intra-abdominal pressure did not exceed 15 mmHg. Next, a 10 mm skin incision was made in the umbilicus and a right and left port was placed about 10 cm lateral to the robot port on the right and left side.  A fourth arm was placed in the left lower quadrant 2 cm above and superior and medial to the anterior superior iliac spine.  All ports were placed under direct visualization.  The patient was placed in steep Trendelenburg.  Omental adhesions to the anterior abdominal wall were  taken down sharply. Bowel was folded away into the upper abdomen.  The robot was docked in the normal manner.  The right and left peritoneum were opened parallel to the IP ligament to open the retroperitoneal spaces bilaterally. The SLN mapping was performed in bilateral pelvic basins. The para rectal and paravesical spaces were opened up entirely with careful dissection below the level  of the ureters bilaterally and to the depth of the uterine artery origin in order to skeletonize the uterine "web" and ensure visualization of all parametrial channels. The para-aortic basins were carefully exposed and evaluated for isolated para-aortic SLN's. Lymphatic channels were identified travelling to the following visualized sentinel lymph node's: right obturator SLN, left obturator SLN, left external iliac 1 and 2. These SLN's were separated from their surrounding lymphatic tissue, removed and sent for permanent pathology.  The distal omentum was resected where it was devitalized from taking down the omentum from the abdominal wall.   The hysterectomy was started after the round ligament on the right side was incised and the retroperitoneum was entered and the pararectal space was developed.  The ureter was noted to be on the medial leaf of the broad ligament.  The peritoneum above the ureter was incised and stretched and the utero-ovarian ligament was skeletonized, cauterized and cut.  The posterior peritoneum was taken down to the level of the KOH ring.  The anterior peritoneum was also taken down.  The bladder flap was created to the level of the KOH ring.  The uterine artery on the right side was skeletonized, cauterized and cut in the normal manner.  A similar procedure was performed on the left.  The colpotomy was made and the uterus, cervix, bilateral tubes were amputated and delivered through the vagina.  Pedicles were inspected and excellent hemostasis was achieved.    The colpotomy at the vaginal cuff was closed with Vicryl on a CT1 needle in a running manner.  Irrigation was used and excellent hemostasis was achieved.  At this point in the procedure was completed.  Robotic instruments were removed under direct visulaization.  The robot was undocked. The 10 mm ports were closed with Vicryl on a UR-5 needle and the fascia was closed with 0 Vicryl on a UR-5 needle.  The skin was closed with  4-0 Vicryl in a subcuticular manner.  Dermabond was applied.  Sponge, lap and needle counts correct x 2.  The patient was taken to the recovery room in stable condition.  The vagina was swabbed with  minimal bleeding noted.   All instrument and needle counts were correct x  3.   The patient was transferred to the recovery room in a stable condition.  Donaciano Eva, MD

## 2017-05-11 NOTE — Anesthesia Preprocedure Evaluation (Addendum)
Anesthesia Evaluation  Patient identified by MRN, date of birth, ID band Patient awake    Reviewed: Allergy & Precautions, NPO status , Patient's Chart, lab work & pertinent test results  Airway Mallampati: II  TM Distance: >3 FB Neck ROM: Full    Dental  (+) Teeth Intact, Dental Advisory Given   Pulmonary Current Smoker,    Pulmonary exam normal breath sounds clear to auscultation       Cardiovascular negative cardio ROS Normal cardiovascular exam Rhythm:Regular Rate:Normal     Neuro/Psych PSYCHIATRIC DISORDERS Anxiety Depression negative neurological ROS     GI/Hepatic Neg liver ROS, GERD  Medicated,  Endo/Other  Hypothyroidism   Renal/GU negative Renal ROS     Musculoskeletal   Abdominal   Peds  Hematology negative hematology ROS (+)   Anesthesia Other Findings   Reproductive/Obstetrics                            Lab Results  Component Value Date   WBC 7.2 05/03/2017   HGB 14.0 05/03/2017   HCT 40.3 05/03/2017   MCV 94.2 05/03/2017   PLT 242 05/03/2017   Lab Results  Component Value Date   CREATININE 0.80 05/03/2017   BUN 7 05/03/2017   NA 138 05/03/2017   K 4.0 05/03/2017   CL 106 05/03/2017   CO2 26 05/03/2017    Anesthesia Physical Anesthesia Plan  ASA: II  Anesthesia Plan: General   Post-op Pain Management:    Induction: Intravenous  PONV Risk Score and Plan: 3 and Midazolam, Scopolamine patch - Pre-op, Dexamethasone and Ondansetron  Airway Management Planned: Oral ETT  Additional Equipment:   Intra-op Plan:   Post-operative Plan: Extubation in OR  Informed Consent: I have reviewed the patients History and Physical, chart, labs and discussed the procedure including the risks, benefits and alternatives for the proposed anesthesia with the patient or authorized representative who has indicated his/her understanding and acceptance.   Dental advisory  given  Plan Discussed with: CRNA  Anesthesia Plan Comments:        Anesthesia Quick Evaluation

## 2017-05-11 NOTE — Anesthesia Postprocedure Evaluation (Signed)
Anesthesia Post Note  Patient: Sara Shannon  Procedure(s) Performed: ROBOTIC ASSISTED TOTAL HYSTERECTOMY (N/A ) BILATERAL SALPINGECTOMY (Bilateral ) SENTINEL NODE BIOPSY (N/A )     Patient location during evaluation: PACU Anesthesia Type: General Level of consciousness: awake and alert Pain management: pain level controlled Vital Signs Assessment: post-procedure vital signs reviewed and stable Respiratory status: spontaneous breathing, nonlabored ventilation, respiratory function stable and patient connected to nasal cannula oxygen Cardiovascular status: blood pressure returned to baseline and stable Postop Assessment: no apparent nausea or vomiting Anesthetic complications: no    Last Vitals:  Vitals:   05/11/17 1730 05/11/17 1753  BP: 110/82 139/82  Pulse: (!) 52 (!) 50  Resp: 12 14  Temp: 36.9 C (!) 36.4 C  SpO2: 100% 99%    Last Pain:  Vitals:   05/11/17 1715  TempSrc:   PainSc: [name]

## 2017-05-11 NOTE — Discharge Instructions (Signed)
05/11/2017  Return to work: 4 weeks  Activity: 1. Be up and out of the bed during the day.  Take a nap if needed.  You may walk up steps but be careful and use the hand rail.  Stair climbing will tire you more than you think, you may need to stop part way and rest.   2. No lifting or straining for 6 weeks.  3. No driving for 1 weeks.  Do Not drive if you are taking narcotic pain medicine.  4. Shower daily.  Use soap and water on your incision and pat dry; don't rub.   5. No sexual activity and nothing in the vagina for 8 weeks.  Medications:  - Take ibuprofen and tylenol first line for pain control. Take these regularly (every 6 hours) to decrease the build up of pain.  - If necessary, for severe pain not relieved by ibuprofen, take percocet.  - While taking percocet you should take sennakot every night to reduce the likelihood of constipation. If this causes diarrhea, stop its use.  Diet: 1. Low sodium Heart Healthy Diet is recommended.  2. It is safe to use a laxative if you have difficulty moving your bowels.   Wound Care: 1. Keep clean and dry.  Shower daily.  Reasons to call the Doctor:   Fever - Oral temperature greater than 100.4 degrees Fahrenheit  Foul-smelling vaginal discharge  Difficulty urinating  Nausea and vomiting  Increased pain at the site of the incision that is unrelieved with pain medicine.  Difficulty breathing with or without chest pain  New calf pain especially if only on one side  Sudden, continuing increased vaginal bleeding with or without clots.   Follow-up: 1. See Everitt Amber in 3 weeks.  Contacts: For questions or concerns you should contact:  Dr. Everitt Amber at [phone] After hours and on week-ends call [phone] and ask to speak to the physician on call for Gynecologic Oncology

## 2017-05-11 NOTE — H&P (Signed)
CC:     Chief Complaint  Patient presents with  . Carcinoma in situ of cervix, unspecified location    HPI: Patient is seen in consultation today at the request of Dr. Everett Graff.  Patient is a very pleasant 37 year old G2P2 (son 39, daughter almost 2) who underwent a cold knife conization on March 23, 2017. Final pathology was consistent with: Diagnosis 1. 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 - THERE IS NO EVIDENCE OF HYPERPLASIA OR MALIGNANCY. 2. 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 - THE SURGICAL RESECTION MARGINS  In the comments of her pathology the depth of invasion was 2 mm in depth of the cervical specimen measured 6 mm. The horizontal extent was 3 mm. There was no lymphovascular space involvement noted. Therefore she has a tumor consistent with a 1 A1 cervix cancer. Therefore she has a tumor consistent with a 1 A1 cervix cancer.   Prior to this, she had had cervical biopsies which revealed high-grade dysplasia. However one cervical biopsy at 12:00 revealed CIN-3 and could not rule out focal microinvasion. Her ECC was negative. Her endometrial biopsy was inactive endometrium with progestational changes. There is no hyperplasia or malignancy.   She states that she believes she had her first abnormal Paps prolong the age of 43 and has intermittently had normal Pap smears but they've often been abnormal. She never received the HPV vaccine. Although she's not had a tubal ligation she's completed her family does not desire to have any other children. She does continue smoke about a pack per day she has done for 16 years. She has tried draping to help decrease smoking cessation but that has not helped. She tried the Decadron patches without any relief. She took Wellbutrin at 1 point for anxiety prior taken her radiology reports and that did not in retrospect  help her decrease her smoking. She is very motivated and interested in quitting smoking.  She's not sure when her last menstrual period was a voice been regular. They've been somewhat abnormal she's been bleeding for about 3 months secondary to the numerous procedures that she has had.  Review of Systems: Constitutional: Denies fever. Skin: No rash Cardiovascular: No chest pain, shortness of breath Pulmonary: + cough, completing z-pack for bronchitis Gastro Intestinal: No nausea, vomiting, constipation, or diarrhea reported.  Genitourinary: No frequency, urgency, or dysuria.  +vaginal bleeding and discharge. Was recently treated for BV and yeast infection with metronidazole and diflucan Psychology: Nervous about this  Current Meds:      Outpatient Encounter Medications as of 04/07/2017  Medication Sig  . famotidine (PEPCID) 20 MG tablet Take 1 tablet (20 mg total) by mouth 2 (two) times daily. (Patient taking differently: Take 20 mg by mouth 2 (two) times daily as needed (for heartburn/indigestion.). )  . Hydrocodone-Acetaminophen (VICODIN) 5-300 MG TABS Take 1 tablet by mouth every 6 (six) hours as needed.  Marland Kitchen ibuprofen (ADVIL,MOTRIN) 600 MG tablet Take 1 tablet (600 mg total) by mouth every 6 (six) hours as needed.  . [DISCONTINUED] ibuprofen (ADVIL,MOTRIN) 600 MG tablet Take 1 tablet (600 mg total) by mouth every 6 (six) hours as needed. (Patient taking differently: Take 600 mg by mouth every 6 (six) hours as needed (for pain.). )   No facility-administered encounter medications on file as of 04/07/2017.     Allergy: No Known Allergies  Social Hx:   Social History  Socioeconomic History  . Marital status: Married    Spouse name: Not on file  . Number of children: 1  . Years of education: Not on file  . Highest education level: Not on file  Social Needs  . Financial resource strain: Not on file  . Food insecurity - worry: Not on file  . Food insecurity -  inability: Not on file  . Transportation needs - medical: Not on file  . Transportation needs - non-medical: Not on file  Occupational History  . Not on file  Tobacco Use  . Smoking status: Current Every Day Smoker    Packs/day: 0.50    Types: Cigarettes  . Smokeless tobacco: Never Used  Substance and Sexual Activity  . Alcohol use: No    Alcohol/week: 0.0 oz  . Drug use: No  . Sexual activity: Yes    Birth control/protection: None  Other Topics Concern  . Not on file  Social History Narrative  . Not on file    Past Surgical Hx:       Past Surgical History:  Procedure Laterality Date  . CERVICAL CONIZATION W/BX N/A 03/23/2017   Procedure: CONIZATION CERVIX WITH BIOPSY;  Surgeon: Everett Graff, MD;  Location: [facility];  Service: Gynecology;  Laterality: N/A;  . CESAREAN SECTION  10/20/02  . CESAREAN SECTION N/A 11/29/2015   Procedure: CESAREAN SECTION;  Surgeon: Everett Graff, MD;  Location: [facility];  Service: Obstetrics;  Laterality: N/A;  RNFA requested - Heather K  . COLPOSCOPY  06/2009   "precancerous cells" on biopsy  . INTRAUTERINE DEVICE INSERTION  06/14/09   Mirena  . IUD REMOVAL N/A 03/23/2017   Procedure: INTRAUTERINE DEVICE (IUD) REMOVAL;  Surgeon: Everett Graff, MD;  Location: [facility];  Service: Gynecology;  Laterality: N/A;  . TONSILLECTOMY    . TONSILLECTOMY AND ADENOIDECTOMY  1992    Past Medical Hx:      Past Medical History:  Diagnosis Date  . Abnormal Pap smear of cervix 06/2009   pos HR HPV with colpo--"precancerous cells" found per pt. 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 Anxiety    prescribed zoloft not taking  . Cancer ([place])    abnormal pap smear  . GERD (gastroesophageal reflux disease)   . Hypothyroidism    no meds back to normal per patient  . Thyroid disease    middle school, returned to normal after childbirth    Oncology Hx:   No history exists.    Family Hx:   Family History  Problem Relation Age of Onset  . Stroke Father 65       blocked carotid artery, left side  . Emphysema Maternal Grandmother        smoker  . Stroke Maternal Grandfather 3  . Dementia Paternal Grandmother   . Cancer Paternal Grandfather        lung, smoker?    Vitals:  Blood pressure 128/79, pulse (!) 54, temperature 98 F (36.7 C), temperature source Oral, resp. rate 18, weight 157 lb (71.2 kg), SpO2 100 %, unknown if currently breastfeeding.  Physical Exam:  Well-nourished well-developed female in no acute distress.  Neck: Supple, no lymphadenopathy, no thyromegaly.  Lungs: Clear to auscultation bilateral.  Cardiac: Regular rate and rhythm.  Abdomen: Well-healed transverse skin incision. Abdomen is soft, nontender, nondistended. There are no palpable masses or hepatomegaly.  Groins: No lymphadenopathy.  Extremities: No edema.  Pelvic: External genitalia within normal limits. Vagina is well  epithelialized. The cervix is visualized. Is status post: With a suture still visible. Bimanual examination the cervix is palpably normal the somewhat foreshortened. The uterus is of normal size shape and consistency. There are no adnexal masses. Rectovaginal confirms no parametrial involvement.  Assessment/Plan: 37 year old with a stage IA 1 cervix carcinoma with 2 mm of depth, 3 mm of horizontal extent and no lymphovascular space involvement. She has completed childbearing and wishes to proceed with definitive surgery. We discussed with her that a definitive extrafascial hysterectomy would be warranted. She had questions regarding radical hysterectomy. I discussed with her as she has such an early stage of disease, a radical hysterectomy is not indicated but that we would recommend sentinel lymph node assessment. She was very pleased to hear that this would be done to make sure that there has not been any cancer spread. The sentinel lymph node technique was  discussed with the patient and her mother.   I discussed with her that there really is no role for preoperative imaging other than a chest x-ray as she is a longtime smoker and is recently had a history of bronchitis.  We discussed the most usual risks and complications of the procedure. Her questions regarding this were elicited in answer to her satisfaction.   Donaciano Eva, MD

## 2017-05-11 NOTE — Transfer of Care (Signed)
Immediate Anesthesia Transfer of Care Note  Patient: Sara Shannon  Procedure(s) Performed: ROBOTIC ASSISTED TOTAL HYSTERECTOMY (N/A ) BILATERAL SALPINGECTOMY (Bilateral ) SENTINEL NODE BIOPSY (N/A )  Patient Location: PACU  Anesthesia Type:General  Level of Consciousness: awake, alert  and oriented  Airway & Oxygen Therapy: Patient Spontanous Breathing and Patient connected to face mask oxygen  Post-op Assessment: Report given to RN and Post -op Vital signs reviewed and stable  Post vital signs: Reviewed and stable  Last Vitals:  Vitals:   05/11/17 1114 05/11/17 1618  BP: 103/66 110/75  Pulse: (!) 57 (!) 58  Resp: 16 15  Temp: 36.8 C 36.9 C  SpO2: 99% 100%    Last Pain:  Vitals:   05/11/17 1114  TempSrc: Oral      Patients Stated Pain Goal: 4 (16/10/96 0454)  Complications: No apparent anesthesia complications

## 2017-05-11 NOTE — Anesthesia Procedure Notes (Signed)
Procedure Name: Intubation Date/Time: 05/11/2017 2:05 PM Performed by: Glory Buff, CRNA Pre-anesthesia Checklist: Patient identified, Emergency Drugs available, Suction available and Patient being monitored Patient Re-evaluated:Patient Re-evaluated prior to induction Oxygen Delivery Method: Circle system utilized Preoxygenation: Pre-oxygenation with 100% oxygen Induction Type: IV induction Ventilation: Mask ventilation without difficulty Laryngoscope Size: Miller and 3 Grade View: Grade I Tube type: Oral Tube size: 7.0 mm Number of attempts: 1 Airway Equipment and Method: Stylet and Oral airway Placement Confirmation: ETT inserted through vocal cords under direct vision,  positive ETCO2 and breath sounds checked- equal and bilateral Secured at: 20 cm Tube secured with: Tape Dental Injury: Teeth and Oropharynx as per pre-operative assessment

## 2017-05-12 ENCOUNTER — Encounter (HOSPITAL_COMMUNITY): Payer: Self-pay | Admitting: Gynecologic Oncology

## 2017-05-12 DIAGNOSIS — F17210 Nicotine dependence, cigarettes, uncomplicated: Secondary | ICD-10-CM | POA: Diagnosis not present

## 2017-05-12 DIAGNOSIS — F419 Anxiety disorder, unspecified: Secondary | ICD-10-CM | POA: Diagnosis not present

## 2017-05-12 DIAGNOSIS — F329 Major depressive disorder, single episode, unspecified: Secondary | ICD-10-CM | POA: Diagnosis not present

## 2017-05-12 DIAGNOSIS — K219 Gastro-esophageal reflux disease without esophagitis: Secondary | ICD-10-CM | POA: Diagnosis not present

## 2017-05-12 DIAGNOSIS — Z79899 Other long term (current) drug therapy: Secondary | ICD-10-CM | POA: Diagnosis not present

## 2017-05-12 DIAGNOSIS — D259 Leiomyoma of uterus, unspecified: Secondary | ICD-10-CM | POA: Diagnosis not present

## 2017-05-12 DIAGNOSIS — N870 Mild cervical dysplasia: Secondary | ICD-10-CM | POA: Diagnosis not present

## 2017-05-12 DIAGNOSIS — C539 Malignant neoplasm of cervix uteri, unspecified: Secondary | ICD-10-CM | POA: Diagnosis not present

## 2017-05-12 LAB — CBC
HEMATOCRIT: 37.7 % (ref 36.0–46.0)
Hemoglobin: 12.7 g/dL (ref 12.0–15.0)
MCH: 31.9 pg (ref 26.0–34.0)
MCHC: 33.7 g/dL (ref 30.0–36.0)
MCV: 94.7 fL (ref 78.0–100.0)
Platelets: 267 10*3/uL (ref 150–400)
RBC: 3.98 MIL/uL (ref 3.87–5.11)
RDW: 14.2 % (ref 11.5–15.5)
WBC: 10.6 10*3/uL — ABNORMAL HIGH (ref 4.0–10.5)

## 2017-05-12 LAB — BASIC METABOLIC PANEL
Anion gap: 6 (ref 5–15)
BUN: 9 mg/dL (ref 6–20)
CHLORIDE: 105 mmol/L (ref 101–111)
CO2: 26 mmol/L (ref 22–32)
CREATININE: 0.85 mg/dL (ref 0.44–1.00)
Calcium: 8.4 mg/dL — ABNORMAL LOW (ref 8.9–10.3)
GFR calc Af Amer: >60 mL/min (ref >60)
GFR calc non Af Amer: >60 mL/min (ref >60)
GLUCOSE: 104 mg/dL — AB (ref 65–99)
Potassium: 4 mmol/L (ref 3.5–5.1)
SODIUM: 137 mmol/L (ref 135–145)

## 2017-05-12 MED ORDER — ACETAMINOPHEN 500 MG PO TABS: 1000.0000 mg | ORAL_TABLET | Freq: Four times a day (QID) | ORAL | 0 refills | Status: DC

## 2017-05-12 MED ORDER — SENNOSIDES-DOCUSATE SODIUM 8.6-50 MG PO TABS: 2.0000 | ORAL_TABLET | Freq: Every day | ORAL | 1 refills | Status: DC

## 2017-05-12 MED ORDER — OXYCODONE HCL 5 MG PO TABS: 5.0000 mg | ORAL_TABLET | ORAL | 0 refills | Status: DC | PRN

## 2017-05-12 NOTE — Discharge Summary (Signed)
Physician Discharge Summary  Patient ID: LADASHIA DEMARINIS MRN: [id number] DOB/AGE: Apr 11, 1980 37 y.o.  Admit date: 05/11/2017 Discharge date: 05/12/2017  Admission Diagnoses: Cervical cancer [facility])  Discharge Diagnoses:  Principal Problem:   Cervical cancer [facility])   Discharged Condition: good  Hospital Course:  1/ patient was admitted on 05/11/17 for a robotic hysterectomy, bilateral salpingectomy and SLN biopsy for cervical cancer. 2/ surgery was uncomplicated  3/ on postoperative day  1 the patient was meeting discharge criteria: tolerating PO, voiding urine, ambulating, pain well controlled on oral medications.  4/ new medications on discharge include senakot, percocet.   Consults: None  Significant Diagnostic Studies: labs:  CBC    Component Value Date/Time   WBC 10.6 (H) 05/12/2017 0600   RBC 3.98 05/12/2017 0600   HGB 12.7 05/12/2017 0600   HGB 14.6 05/17/2014 0938   HCT 37.7 05/12/2017 0600   PLT 267 05/12/2017 0600   MCV 94.7 05/12/2017 0600   MCH 31.9 05/12/2017 0600   MCHC 33.7 05/12/2017 0600   RDW 14.2 05/12/2017 0600   LYMPHSABS 2.3 10/07/2013 1941   MONOABS 0.6 10/07/2013 1941   EOSABS 0.2 10/07/2013 1941   BASOSABS 0.0 10/07/2013 1941     Treatments: surgery: see above  Discharge Exam: Blood pressure (!) 97/50, pulse (!) 55, temperature 97.9 F (36.6 C), temperature source Oral, resp. rate 17, height 5\' 5"  (1.651 m), weight 147 lb (66.7 kg), last menstrual period 04/26/2017, SpO2 100 %, unknown if currently breastfeeding. General appearance: alert and cooperative GI: soft, non-tender; bowel sounds normal; no masses,  no organomegaly Incision/Wound: clean and in tact x 5  Disposition: 01-Home or Self Care  Discharge Instructions    (HEART FAILURE PATIENTS) Call MD:  Anytime you have any of the following symptoms: 1) 3 pound weight gain in 24 hours or 5 pounds in 1 week 2) shortness of breath, with or without a dry hacking cough 3) swelling in the  hands, feet or stomach 4) if you have to sleep on extra pillows at night in order to breathe.   Complete by:  As directed    Call MD for:  difficulty breathing, headache or visual disturbances   Complete by:  As directed    Call MD for:  extreme fatigue   Complete by:  As directed    Call MD for:  hives   Complete by:  As directed    Call MD for:  persistant dizziness or light-headedness   Complete by:  As directed    Call MD for:  persistant nausea and vomiting   Complete by:  As directed    Call MD for:  redness, tenderness, or signs of infection (pain, swelling, redness, odor or green/yellow discharge around incision site)   Complete by:  As directed    Call MD for:  severe uncontrolled pain   Complete by:  As directed    Call MD for:  temperature >100.4   Complete by:  As directed    Diet - low sodium heart healthy   Complete by:  As directed    Diet general   Complete by:  As directed    Driving Restrictions   Complete by:  As directed    No driving for 7 days or until off narcotic pain medication   Increase activity slowly   Complete by:  As directed    Remove dressing in 24 hours   Complete by:  As directed    Sexual Activity Restrictions   Complete  by:  As directed    No intercourse for 6 weeks     Allergies as of 05/12/2017   No Known Allergies     Medication List    TAKE these medications   acetaminophen 500 MG tablet Commonly known as:  TYLENOL Take 2 tablets (1,000 mg total) by mouth every 6 (six) hours.   albuterol 108 (90 Base) MCG/ACT inhaler Commonly known as:  PROVENTIL HFA;VENTOLIN HFA Inhale 1-2 puffs into the lungs every 6 (six) hours as needed for wheezing or shortness of breath.   amoxicillin-clavulanate 875-125 MG tablet Commonly known as:  AUGMENTIN Take 1 tablet by mouth 2 (two) times daily.   clotrimazole-betamethasone cream Commonly known as:  LOTRISONE Apply 1 application topically 2 (two) times daily as needed. For irritation.    famotidine 20 MG tablet Commonly known as:  PEPCID Take 1 tablet (20 mg total) by mouth 2 (two) times daily. What changed:    when to take this  reasons to take this   Hydrocodone-Acetaminophen 5-300 MG Tabs Commonly known as:  VICODIN Take 1 tablet by mouth every 6 (six) hours as needed. What changed:  reasons to take this   ibuprofen 600 MG tablet Commonly known as:  ADVIL,MOTRIN Take 1 tablet (600 mg total) by mouth every 6 (six) hours as needed. What changed:  reasons to take this   oxyCODONE 5 MG immediate release tablet Commonly known as:  Oxy IR/ROXICODONE Take 1 tablet (5 mg total) by mouth every 4 (four) hours as needed for severe pain or breakthrough pain.   senna-docusate 8.6-50 MG tablet Commonly known as:  Senokot-S Take 2 tablets by mouth at bedtime.   sertraline 25 MG tablet Commonly known as:  ZOLOFT Take 25 mg by mouth daily.        Signed: Donaciano Eva 05/12/2017, 10:22 AM

## 2017-05-13 ENCOUNTER — Telehealth: Payer: Self-pay | Admitting: Gynecologic Oncology

## 2017-05-13 NOTE — Telephone Encounter (Signed)
Returned call to patient's mother.  They had called the office with complaints of bilateral pain in the shoulders.  Per Dr. Denman George, patient is to take ibuprofen every six hours around the clock with food for the next two-three days and should call with an update on her symptoms.

## 2017-05-15 ENCOUNTER — Other Ambulatory Visit: Payer: Self-pay | Admitting: Obstetrics & Gynecology

## 2017-05-15 DIAGNOSIS — G8918 Other acute postprocedural pain: Secondary | ICD-10-CM

## 2017-05-15 MED ORDER — CYCLOBENZAPRINE HCL 10 MG PO TABS: 10.0000 mg | ORAL_TABLET | Freq: Three times a day (TID) | ORAL | 0 refills | Status: DC | PRN

## 2017-05-15 NOTE — Progress Notes (Signed)
The patient called with shoulder pain.  Rx for flexeril sent to the pharmacy

## 2017-05-16 ENCOUNTER — Emergency Department (HOSPITAL_COMMUNITY): Admission: EM | Admit: 2017-05-16 | Discharge: 2017-05-16 | Discharge status: Against Medical Advice | Payer: 59

## 2017-05-16 NOTE — ED Notes (Signed)
Pt called x2 for triage, no response from lobby

## 2017-05-16 NOTE — ED Notes (Signed)
Pt called for triage, no response from the lobby. 

## 2017-05-17 ENCOUNTER — Telehealth: Payer: Self-pay | Admitting: Gynecologic Oncology

## 2017-05-17 ENCOUNTER — Telehealth: Payer: Self-pay | Admitting: *Deleted

## 2017-05-17 NOTE — Telephone Encounter (Signed)
Called to check on patient's post-op status.  Shoulder and back pain is getting better and is manageable.  She states she feels she has gained some water weight and states she has started taking stool softeners with her last BM yesterday.  Final path discussed.  No concerns voiced.  Advised to call for any needs or concerns.

## 2017-05-17 NOTE — Telephone Encounter (Signed)
Returned the patient's call regarding her post op appt and pathology report. Gave the patient the post op appt for March 7th at 3:15pm, arrive at 3pm. Also explained that "the pathology report isn't back yet, sometimes it can take a full week before results are back. We will call you when the are resulted." Patient verbalized understanding.

## 2017-05-25 ENCOUNTER — Inpatient Hospital Stay: Payer: 59 | Attending: Gynecologic Oncology | Admitting: Gynecologic Oncology

## 2017-05-25 ENCOUNTER — Encounter (HOSPITAL_COMMUNITY): Payer: Self-pay

## 2017-05-25 ENCOUNTER — Telehealth: Payer: Self-pay | Admitting: *Deleted

## 2017-05-25 ENCOUNTER — Other Ambulatory Visit: Payer: Self-pay | Admitting: Gynecologic Oncology

## 2017-05-25 ENCOUNTER — Inpatient Hospital Stay: Payer: 59

## 2017-05-25 ENCOUNTER — Ambulatory Visit (HOSPITAL_COMMUNITY)
Admission: RE | Admit: 2017-05-25 | Discharge: 2017-05-25 | Disposition: A | Discharge status: Home / Self Care | Payer: 59 | Source: Ambulatory Visit | Attending: Gynecologic Oncology | Admitting: Gynecologic Oncology

## 2017-05-25 ENCOUNTER — Telehealth: Payer: Self-pay | Admitting: Gynecologic Oncology

## 2017-05-25 ENCOUNTER — Encounter: Payer: Self-pay | Admitting: Gynecologic Oncology

## 2017-05-25 VITALS — BP 132/83 | HR 96 | Temp 97.7°F | Resp 18

## 2017-05-25 DIAGNOSIS — G8918 Other acute postprocedural pain: Secondary | ICD-10-CM

## 2017-05-25 DIAGNOSIS — R109 Unspecified abdominal pain: Principal | ICD-10-CM

## 2017-05-25 DIAGNOSIS — R110 Nausea: Secondary | ICD-10-CM | POA: Diagnosis not present

## 2017-05-25 DIAGNOSIS — R509 Fever, unspecified: Secondary | ICD-10-CM | POA: Diagnosis not present

## 2017-05-25 DIAGNOSIS — Z90710 Acquired absence of both cervix and uterus: Secondary | ICD-10-CM | POA: Diagnosis not present

## 2017-05-25 DIAGNOSIS — C519 Malignant neoplasm of vulva, unspecified: Secondary | ICD-10-CM | POA: Insufficient documentation

## 2017-05-25 DIAGNOSIS — Z90722 Acquired absence of ovaries, bilateral: Secondary | ICD-10-CM | POA: Diagnosis not present

## 2017-05-25 DIAGNOSIS — R6883 Chills (without fever): Secondary | ICD-10-CM | POA: Insufficient documentation

## 2017-05-25 DIAGNOSIS — R1031 Right lower quadrant pain: Secondary | ICD-10-CM | POA: Diagnosis not present

## 2017-05-25 DIAGNOSIS — D069 Carcinoma in situ of cervix, unspecified: Secondary | ICD-10-CM

## 2017-05-25 DIAGNOSIS — R918 Other nonspecific abnormal finding of lung field: Secondary | ICD-10-CM | POA: Diagnosis not present

## 2017-05-25 DIAGNOSIS — R1010 Upper abdominal pain, unspecified: Secondary | ICD-10-CM | POA: Diagnosis not present

## 2017-05-25 LAB — COMPREHENSIVE METABOLIC PANEL
ALBUMIN: 3.7 g/dL (ref 3.5–5.0)
ALT: 14 U/L (ref 0–55)
ANION GAP: 10 (ref 3–11)
AST: 12 U/L (ref 5–34)
Alkaline Phosphatase: 87 U/L (ref 40–150)
BILIRUBIN TOTAL: 1.2 mg/dL (ref 0.2–1.2)
BUN: 7 mg/dL (ref 7–26)
CO2: 24 mmol/L (ref 22–29)
Calcium: 9.4 mg/dL (ref 8.4–10.4)
Chloride: 104 mmol/L (ref 98–109)
Creatinine, Ser: 0.77 mg/dL (ref 0.60–1.10)
GFR calc non Af Amer: >60 mL/min (ref >60)
GLUCOSE: 96 mg/dL (ref 70–140)
POTASSIUM: 3.3 mmol/L — AB (ref 3.5–5.1)
SODIUM: 138 mmol/L (ref 136–145)
Total Protein: 7.5 g/dL (ref 6.4–8.3)

## 2017-05-25 LAB — URINALYSIS, COMPLETE (UACMP) WITH MICROSCOPIC
BILIRUBIN URINE: NEGATIVE
GLUCOSE, UA: NEGATIVE mg/dL
KETONES UR: 5 mg/dL — AB
LEUKOCYTES UA: NEGATIVE
Nitrite: NEGATIVE
PROTEIN: NEGATIVE mg/dL
Specific Gravity, Urine: 1.01 (ref 1.005–1.030)
pH: 5 (ref 5.0–8.0)

## 2017-05-25 LAB — CBC WITH DIFFERENTIAL (CANCER CENTER ONLY)
BASOS PCT: 1 %
Basophils Absolute: 0 10*3/uL (ref 0.0–0.1)
Eosinophils Absolute: 0 10*3/uL (ref 0.0–0.5)
Eosinophils Relative: 0 %
HEMATOCRIT: 39.1 % (ref 34.8–46.6)
HEMOGLOBIN: 13.2 g/dL (ref 11.6–15.9)
LYMPHS PCT: 17 %
Lymphs Abs: 1.4 10*3/uL (ref 0.9–3.3)
MCH: 32.4 pg (ref 25.1–34.0)
MCHC: 33.8 g/dL (ref 31.5–36.0)
MCV: 95.8 fL (ref 79.5–101.0)
Monocytes Absolute: 0.7 10*3/uL (ref 0.1–0.9)
Monocytes Relative: 9 %
NEUTROS ABS: 5.8 10*3/uL (ref 1.5–6.5)
NEUTROS PCT: 73 %
Platelet Count: 314 10*3/uL (ref 145–400)
RBC: 4.08 MIL/uL (ref 3.70–5.45)
RDW: 14.4 % (ref 11.2–14.5)
WBC Count: 8 10*3/uL (ref 3.9–10.3)

## 2017-05-25 MED ORDER — IOPAMIDOL (ISOVUE-300) INJECTION 61%
INTRAVENOUS | Status: AC
  Filled 2017-05-25: qty 100

## 2017-05-25 MED ORDER — IOPAMIDOL (ISOVUE-300) INJECTION 61%
100.0000 mL | Freq: Once | INTRAVENOUS | Status: AC | PRN
  Administered 2017-05-25: 100 mL via INTRAVENOUS

## 2017-05-25 NOTE — Patient Instructions (Signed)
Plan to have CT scan today to further evaluate cause for your severe abdominal pain.  We will contact you with the results.  Also try to increase intake of potassium rich foods since your potassium level is 3.3.    Hypokalemia (your level is slightly low so some of the information does not apply to you.  Would pay attention to the potassium rich foods) Hypokalemia means that the amount of potassium in the blood is lower than normal.Potassium is a chemical that helps regulate the amount of fluid in the body (electrolyte). It also stimulates muscle tightening (contraction) and helps nerves work properly.Normally, most of the body's potassium is inside of cells, and only a very small amount is in the blood. Because the amount in the blood is so small, minor changes to potassium levels in the blood can be life-threatening. What are the causes? This condition may be caused by:  Antibiotic medicine.  Diarrhea or vomiting. Taking too much of a medicine that helps you have a bowel movement (laxative) can cause diarrhea and lead to hypokalemia.  Chronic kidney disease (CKD).  Medicines that help the body get rid of excess fluid (diuretics).  Eating disorders, such as bulimia.  Low magnesium levels in the body.  Sweating a lot.  What are the signs or symptoms? Symptoms of this condition include:  Weakness.  Constipation.  Fatigue.  Muscle cramps.  Mental confusion.  Skipped heartbeats or irregular heartbeat (palpitations).  Tingling or numbness.  How is this diagnosed? This condition is diagnosed with a blood test. How is this treated? Hypokalemia can be treated by taking potassium supplements by mouth or adjusting the medicines that you take. Treatment may also include eating more foods that contain a lot of potassium. If your potassium level is very low, you may need to get potassium through an IV tube in one of your veins and be monitored in the hospital. Follow these  instructions at home:  Take over-the-counter and prescription medicines only as told by your health care provider. This includes vitamins and supplements.  Eat a healthy diet. A healthy diet includes fresh fruits and vegetables, whole grains, healthy fats, and lean proteins.  If instructed, eat more foods that contain a lot of potassium, such as: ? Nuts, such as peanuts and pistachios. ? Seeds, such as sunflower seeds and pumpkin seeds. ? Peas, lentils, and lima beans. ? Whole grain and bran cereals and breads. ? Fresh fruits and vegetables, such as apricots, avocado, bananas, cantaloupe, kiwi, oranges, tomatoes, asparagus, and potatoes. ? Orange juice. ? Tomato juice. ? Red meats. ? Yogurt.  Keep all follow-up visits as told by your health care provider. This is important. Contact a health care provider if:  You have weakness that gets worse.  You feel your heart pounding or racing.  You vomit.  You have diarrhea.  You have diabetes (diabetes mellitus) and you have trouble keeping your blood sugar (glucose) in your target range. Get help right away if:  You have chest pain.  You have shortness of breath.  You have vomiting or diarrhea that lasts for more than 2 days.  You faint. This information is not intended to replace advice given to you by your health care provider. Make sure you discuss any questions you have with your health care provider. Document Released: 03/23/2005 Document Revised: 11/09/2015 Document Reviewed: 11/09/2015 Elsevier Interactive Patient Education  2018 [employer].  Potassium Content of Foods Potassium is a mineral found in many foods and drinks. It  helps keep fluids and minerals balanced in your body and affects how steadily your heart beats. Potassium also helps control your blood pressure and keep your muscles and nervous system healthy. Certain health conditions and medicines may change the balance of potassium in your body. When this  happens, you can help balance your level of potassium through the foods that you do or do not eat. Your health care provider or dietitian may recommend an amount of potassium that you should have each day. The following lists of foods provide the amount of potassium (in parentheses) per serving in each item. High in potassium The following foods and beverages have 200 mg or more of potassium per serving:  Apricots, 2 raw or 5 dry (200 mg).  Artichoke, 1 medium (345 mg).  Avocado, raw,  each (245 mg).  Banana, 1 medium (425 mg).  Beans, lima, or baked beans, canned,  cup (280 mg).  Beans, white, canned,  cup (595 mg).  Beef roast, 3 oz (320 mg).  Beef, ground, 3 oz (270 mg).  Beets, raw or cooked,  cup (260 mg).  Bran muffin, 2 oz (300 mg).  Broccoli,  cup (230 mg).  Brussels sprouts,  cup (250 mg).  Cantaloupe,  cup (215 mg).  Cereal, 100% bran,  cup (200-400 mg).  Cheeseburger, single, fast food, 1 each (225-400 mg).  Chicken, 3 oz (220 mg).  Clams, canned, 3 oz (535 mg).  Crab, 3 oz (225 mg).  Dates, 5 each (270 mg).  Dried beans and peas,  cup (300-475 mg).  Figs, dried, 2 each (260 mg).  Fish: halibut, tuna, cod, snapper, 3 oz (480 mg).  Fish: salmon, haddock, swordfish, perch, 3 oz (300 mg).  Fish, tuna, canned 3 oz (200 mg).  [place] fries, fast food, 3 oz (470 mg).  Granola with fruit and nuts,  cup (200 mg).  Grapefruit juice,  cup (200 mg).  Greens, beet,  cup (655 mg).  Honeydew melon,  cup (200 mg).  Kale, raw, 1 cup (300 mg).  Kiwi, 1 medium (240 mg).  Kohlrabi, rutabaga, parsnips,  cup (280 mg).  Lentils,  cup (365 mg).  Mango, 1 each (325 mg).  Milk, chocolate, 1 cup (420 mg).  Milk: nonfat, low-fat, whole, buttermilk, 1 cup (350-380 mg).  Molasses, 1 Tbsp (295 mg).  Mushrooms,  cup (280) mg.  Nectarine, 1 each (275 mg).  Nuts: almonds, peanuts, hazelnuts, [place], cashew, mixed, 1 oz (200 mg).  Nuts,  pistachios, 1 oz (295 mg).  Orange, 1 each (240 mg).  Orange juice,  cup (235 mg).  Papaya, medium,  fruit (390 mg).  Peanut butter, chunky, 2 Tbsp (240 mg).  Peanut butter, smooth, 2 Tbsp (210 mg).  Pear, 1 medium (200 mg).  Pomegranate, 1 whole (400 mg).  Pomegranate juice,  cup (215 mg).  Pork, 3 oz (350 mg).  Potato chips, salted, 1 oz (465 mg).  Potato, baked with skin, 1 medium (925 mg).  Potatoes, boiled,  cup (255 mg).  Potatoes, mashed,  cup (330 mg).  Prune juice,  cup (370 mg).  Prunes, 5 each (305 mg).  Pudding, chocolate,  cup (230 mg).  Pumpkin, canned,  cup (250 mg).  Raisins, seedless,  cup (270 mg).  Seeds, sunflower or pumpkin, 1 oz (240 mg).  Soy milk, 1 cup (300 mg).  Spinach,  cup (420 mg).  Spinach, canned,  cup (370 mg).  Sweet potato, baked with skin, 1 medium (450 mg).  Swiss chard,  cup (480  mg).  Tomato or vegetable juice,  cup (275 mg).  Tomato sauce or puree,  cup (400-550 mg).  Tomato, raw, 1 medium (290 mg).  Tomatoes, canned,  cup (200-300 mg).  [place], 3 oz (250 mg).  Wheat germ, 1 oz (250 mg).  Winter squash,  cup (250 mg).  Yogurt, plain or fruited, 6 oz (260-435 mg).  Zucchini,  cup (220 mg).  Moderate in potassium The following foods and beverages have 50-200 mg of potassium per serving:  Apple, 1 each (150 mg).  Apple juice,  cup (150 mg).  Applesauce,  cup (90 mg).  Apricot nectar,  cup (140 mg).  Asparagus, small spears,  cup or 6 spears (155 mg).  Bagel, cinnamon raisin, 1 each (130 mg).  Bagel, egg or plain, 4 in., 1 each (70 mg).  Beans, green,  cup (90 mg).  Beans, yellow,  cup (190 mg).  Beer, regular, 12 oz (100 mg).  Beets, canned,  cup (125 mg).  Blackberries,  cup (115 mg).  Blueberries,  cup (60 mg).  Bread, whole wheat, 1 slice (70 mg).  Broccoli, raw,  cup (145 mg).  Cabbage,  cup (150 mg).  Carrots, cooked or raw,  cup (180  mg).  Cauliflower, raw,  cup (150 mg).  Celery, raw,  cup (155 mg).  Cereal, bran flakes, cup (120-150 mg).  Cheese, cottage,  cup (110 mg).  Cherries, 10 each (150 mg).  Chocolate, 1 oz bar (165 mg).  Coffee, brewed 6 oz (90 mg).  Corn,  cup or 1 ear (195 mg).  Cucumbers,  cup (80 mg).  Egg, large, 1 each (60 mg).  Eggplant,  cup (60 mg).  Endive, raw, cup (80 mg).  English muffin, 1 each (65 mg).  Fish, orange roughy, 3 oz (150 mg).  Frankfurter, beef or pork, 1 each (75 mg).  Fruit cocktail,  cup (115 mg).  Grape juice,  cup (170 mg).  Grapefruit,  fruit (175 mg).  Grapes,  cup (155 mg).  Greens: kale, turnip, collard,  cup (110-150 mg).  Ice cream or frozen yogurt, chocolate,  cup (175 mg).  Ice cream or frozen yogurt, vanilla,  cup (120-150 mg).  Lemons, limes, 1 each (80 mg).  Lettuce, all types, 1 cup (100 mg).  Mixed vegetables,  cup (150 mg).  Mushrooms, raw,  cup (110 mg).  Nuts: walnuts, pecans, or macadamia, 1 oz (125 mg).  Oatmeal,  cup (80 mg).  Okra,  cup (110 mg).  Onions, raw,  cup (120 mg).  Peach, 1 each (185 mg).  Peaches, canned,  cup (120 mg).  Pears, canned,  cup (120 mg).  Peas, green, frozen,  cup (90 mg).  Peppers, green,  cup (130 mg).  Peppers, red,  cup (160 mg).  Pineapple juice,  cup (165 mg).  Pineapple, fresh or canned,  cup (100 mg).  Plums, 1 each (105 mg).  Pudding, vanilla,  cup (150 mg).  Raspberries,  cup (90 mg).  Rhubarb,  cup (115 mg).  Rice, wild,  cup (80 mg).  Shrimp, 3 oz (155 mg).  Spinach, raw, 1 cup (170 mg).  Strawberries,  cup (125 mg).  Summer squash  cup (175-200 mg).  Swiss chard, raw, 1 cup (135 mg).  Tangerines, 1 each (140 mg).  Tea, brewed, 6 oz (65 mg).  Turnips,  cup (140 mg).  Watermelon,  cup (85 mg).  Wine, red, table, 5 oz (180 mg).  Wine, white, table, 5 oz (100 mg).  Low in  potassium The following foods and  beverages have less than 50 mg of potassium per serving.  Bread, white, 1 slice (30 mg).  Carbonated beverages, 12 oz (less than 5 mg).  Cheese, 1 oz (20-30 mg).  Cranberries,  cup (45 mg).  Cranberry juice cocktail,  cup (20 mg).  Fats and oils, 1 Tbsp (less than 5 mg).  Hummus, 1 Tbsp (32 mg).  Nectar: papaya, mango, or pear,  cup (35 mg).  Rice, white or brown,  cup (50 mg).  Spaghetti or macaroni,  cup cooked (30 mg).  Tortilla, flour or corn, 1 each (50 mg).  Waffle, 4 in., 1 each (50 mg).  Water chestnuts,  cup (40 mg).  This information is not intended to replace advice given to you by your health care provider. Make sure you discuss any questions you have with your health care provider. Document Released: 11/04/2004 Document Revised: 08/29/2015 Document Reviewed: 02/17/2013 Elsevier Interactive Patient Education  [employer].

## 2017-05-25 NOTE — Progress Notes (Signed)
Follow Up Note: Gyn-Onc  Sara Shannon 37 y.o. female  CC:  Chief Complaint  Patient presents with  . Post-op Problem    Abdominal Pain    HPI: Sara Shannon is a 37 year old female, G2P2, initially referred by Dr. Everett Graff for cervical cancer.  She underwent a cold knife conization on March 23, 2017 resulting: Diagnosis 1. 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 - THERE IS NO EVIDENCE OF HYPERPLASIA OR MALIGNANCY. 2. 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 - THE SURGICAL RESECTION MARGINS ARE NEGATIVE FOR CARCINOMA.  In the comments of her pathology the depth of invasion was 2 mm in depth of the cervical specimen measured 6 mm. The horizontal extent was 3 mm. There was no lymphovascular space involvement noted. Therefore she has a tumor consistent with a 1 A1 cervix cancer. Therefore she has a tumor consistent with a 1 A1 cervix cancer.   Her past gynecologic history includes cervical biopsies which revealed high-grade dysplasia with one cervical biopsy at 12:00 revealed CIN-3 and could not rule out focal microinvasion. ECC was negative and endometrial biopsy was inactive endometrium with progestational changes. There is no hyperplasia or malignancy. Her first abnormal pap was at the age of 89 and she has intermittent normal pap smears but they've often been abnormal. Denies receiving HPV vaccine.  On 05/11/2017, she underwent a robotic-assisted laparoscopic total hysterectomy with bilateral salpingectomy, SLN biopsy with Dr. Everitt Amber. Her post-operative course was uneventful.  Final path resulted:  Diagnosis 1. 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 Lymph node, sentinel, biopsy, right obturator - NO CARCINOMA IDENTIFIED IN THREE LYMPH NODES (0/3) - SEE COMMENT 3. Lymph node, sentinel, biopsy, left external iliac #1 - NO CARCINOMA IDENTIFIED IN ONE LYMPH NODE (0/1) - SEE COMMENT 4. Lymph node, sentinel, biopsy, left external iliac #2 - NO CARCINOMA IDENTIFIED IN ONE LYMPH NODE (0/1) - SEE COMMENT 5. Lymph node, sentinel, biopsy, left obturator - NO CARCINOMA IDENTIFIED IN ONE LYMPH NODE (0/1) - SEE COMMENT 6. Omentum, biopsy - NO CARCINOMA IDENTIFIED    Interval History:  She presents today with complaints of severe abdominal pain, more in lower abdomen and RLQ post-operatively, that was present with urination after surgery (lessened after emptying bladder) but has significantly worsened since last Thursday.  She states the pain varies in location from under the umbilicus to the RLQ abdomen with radiation to lateral groins intermittently.  She states she started taking Miralax for her bowels with little relief but was able to have a BM this am after sitting on the toilet for a long period of time with moderate diaphoresis and feelings of faintness.  No action alleviates her abdominal discomfort.  Pain worsens when laying down.  No PRN Pain medication offers relief.  Reports chills with temperature not going above 100.  Stating the pain comes on prior to urination intermittently but denies hematuria, frequency, or urgency.  States she feels she is emptying her bladder completely.  Denies vaginal bleeding or discharge.  Denies rectal bleeding.  Nauseated intermittently with decreased appetite but no emesis reported.  "In relation to my c section, this pain is horrible." No other concerns voiced.  Review of Systems: Constitutional: Feels poorly.  Severe abdominal pain.  Intermittent chills with no documented fever.  Cardiovascular: No chest pain,  shortness of breath, or edema.  Pulmonary: No cough or wheeze.  Gastrointestinal: Positive for nausea and constipation. No vomiting or diarrhea. No bright red blood per rectum.  Genitourinary: Abdominal pain worsens prior to urination. No frequency, urgency. No vaginal bleeding or discharge.  Musculoskeletal: No myalgia or joint pain. Neurologic: No weakness, numbness, or change in gait.  Psychology: No depression, anxiety, or insomnia.  Current Meds:  Outpatient Encounter Medications as of 05/25/2017  Medication Sig  . acetaminophen (TYLENOL) 500 MG tablet Take 2 tablets (1,000 mg total) by mouth every 6 (six) hours.  Marland Kitchen albuterol (PROVENTIL HFA;VENTOLIN HFA) 108 (90 Base) MCG/ACT inhaler Inhale 1-2 puffs into the lungs every 6 (six) hours as needed for wheezing or shortness of breath.  Marland Kitchen amoxicillin-clavulanate (AUGMENTIN) 875-125 MG tablet Take 1 tablet by mouth 2 (two) times daily.  . famotidine (PEPCID) 20 MG tablet Take 1 tablet (20 mg total) by mouth 2 (two) times daily. (Patient taking differently: Take 20 mg by mouth 2 (two) times daily as needed (for heartburn/indigestion.). )  . Hydrocodone-Acetaminophen (VICODIN) 5-300 MG TABS Take 1 tablet by mouth every 6 (six) hours as needed. (Patient taking differently: Take 1 tablet by mouth every 6 (six) hours as needed (for pain). )  . ibuprofen (ADVIL,MOTRIN) 600 MG tablet Take 1 tablet (600 mg total) by mouth every 6 (six) hours as needed. (Patient taking differently: Take 600 mg by mouth every 6 (six) hours as needed (for pain.). )  . senna-docusate (SENOKOT-S) 8.6-50 MG tablet Take 2 tablets by mouth at bedtime.  . sertraline (ZOLOFT) 25 MG tablet Take 25 mg by mouth daily.  . clotrimazole-betamethasone (LOTRISONE) cream Apply 1 application topically 2 (two) times daily as needed. For irritation.  . cyclobenzaprine (FLEXERIL) 10 MG tablet Take 1 tablet (10 mg total) by mouth 3 (three) times daily as needed for muscle spasms. (Patient not  taking: Reported on 05/25/2017)  . [DISCONTINUED] oxyCODONE (OXY IR/ROXICODONE) 5 MG immediate release tablet Take 1 tablet (5 mg total) by mouth every 4 (four) hours as needed for severe pain or breakthrough pain.   No facility-administered encounter medications on file as of 05/25/2017.     Allergy: No Known Allergies  Social Hx:   Social History   Socioeconomic History  . Marital status: Married    Spouse name: Not on file  . Number of children: 1  . Years of education: Not on file  . Highest education level: Not on file  Social Needs  . Financial resource strain: Not on file  . Food insecurity - worry: Not on file  . Food insecurity - inability: Not on file  . Transportation needs - medical: Not on file  . Transportation needs - non-medical: Not on file  Occupational History  . Not on file  Tobacco Use  . Smoking status: Current Every Day Smoker    Packs/day: 1.00    Years: 16.00    Pack years: 16.00    Types: Cigarettes  . Smokeless tobacco: Never Used  Substance and Sexual Activity  . Alcohol use: No    Alcohol/week: 0.0 oz  . Drug use: No  . Sexual activity: Yes    Birth control/protection: None  Other Topics Concern  . Not on file  Social History Narrative  . Not on file    Past Surgical Hx:  Past Surgical History:  Procedure Laterality Date  . BILATERAL SALPINGECTOMY Bilateral 05/11/2017   Procedure: BILATERAL SALPINGECTOMY;  Surgeon: Everitt Amber, MD;  Location: [facility];  Service: Gynecology;  Laterality: Bilateral;  . CERVICAL CONIZATION W/BX N/A 03/23/2017   Procedure: CONIZATION CERVIX WITH BIOPSY;  Surgeon: Everett Graff, MD;  Location: [facility];  Service: Gynecology;  Laterality: N/A;  . CESAREAN SECTION  10/20/02  . CESAREAN SECTION N/A 11/29/2015   Procedure: CESAREAN SECTION;  Surgeon: Everett Graff, MD;  Location: [facility];  Service: Obstetrics;  Laterality: N/A;  RNFA requested - Heather K  . COLPOSCOPY  06/2009   "precancerous cells" on  biopsy  . INTRAUTERINE DEVICE INSERTION  06/14/09   Mirena  . IUD REMOVAL N/A 03/23/2017   Procedure: INTRAUTERINE DEVICE (IUD) REMOVAL;  Surgeon: Everett Graff, MD;  Location: [facility];  Service: Gynecology;  Laterality: N/A;  . ROBOTIC ASSISTED TOTAL HYSTERECTOMY N/A 05/11/2017   Procedure: ROBOTIC ASSISTED TOTAL HYSTERECTOMY;  Surgeon: Everitt Amber, MD;  Location: [facility];  Service: Gynecology;  Laterality: N/A;  . SENTINEL NODE BIOPSY N/A 05/11/2017   Procedure: SENTINEL NODE BIOPSY;  Surgeon: Everitt Amber, MD;  Location: [facility];  Service: Gynecology;  Laterality: N/A;  . TONSILLECTOMY    . TONSILLECTOMY AND ADENOIDECTOMY  1992    Past Medical Hx:  Past Medical History:  Diagnosis Date  . Abnormal Pap smear of cervix 06/2009   pos HR HPV with colpo--"precancerous cells" found per pt. 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 Anxiety    prescribed zoloft not taking  . Bronchitis    hx of   . Cancer ([place])    abnormal pap smear  . Depression   . GERD (gastroesophageal reflux disease)    hx of   . Hypothyroidism    no meds back to normal per patient  . Thyroid disease    middle school, returned to normal after childbirth    Family Hx:  Family History  Problem Relation Age of Onset  . Stroke Father 8       blocked carotid artery, left side  . Emphysema Maternal Grandmother        smoker  . Stroke Maternal Grandfather 68  . Dementia Paternal Grandmother   . Cancer Paternal Grandfather        lung, smoker?    Vitals:  Blood pressure 132/83, pulse 96, temperature 97.7 F (36.5 C), temperature source Oral, resp. rate 18, last menstrual period 04/26/2017, SpO2 100 %, unknown if currently breastfeeding.  Physical Exam:  General: Well developed, well nourished female in mild distress related to abd pain. Alert and oriented x 3. Grimacing and guarding abdomen intermittently.  Cardiovascular: Regular rate and rhythm. S1 and S2 normal.  Lungs: Clear to auscultation  bilaterally. No wheezes/crackles/rhonchi noted.  Skin: No rashes or lesions present. Back: No CVA tenderness.  Abdomen: Abdomen soft, significantly tender more in RLQ. Hypoactive bowel sounds in all quadrants. Lap sites to the abdomen healing with no erythema or drainage.  Mild ecchymosis noted along right aspect of healed c section incision.  Pelvic: Deferred per pt since not having drainage or bleeding.  Extremities: No bilateral cyanosis, edema, or clubbing.   Assessment/Plan: 37 year old female s/p robotic-assisted laparoscopic total hysterectomy with bilateral salpingectomy, SLN biopsy on 05/11/17 with Dr. Denman George with new onset post-operative severe abdominal pain and nausea.  Abdominal xray films reviewed (report pending). We will obtain a CBC and Cmet now to evaluate for leukocytosis and  to assess kidney function.  Plan for a stat CT abdomen and pelvis to rule out pelvic fluid collection, ureteral or bowel injury. Urine sample will be obtained for analysis and culture to rule out infection.         Dorothyann Gibbs, NP 05/25/2017, 11:46 AM   Update 1:29pm: Patient drinking oral contrast in exam room.  Alert, oriented, in no acute distress but visually uncomfortable due to abdominal pain.  Labs reviewed below.  Awaiting radiologist review of abdomen 2 view.  Plan for CT scan around 2-3pm today at [facility].  Urinalysis without strong evidence of infection. Culture pending.    CBC    Component Value Date/Time   WBC 8.0 05/25/2017 1158   WBC 10.6 (H) 05/12/2017 0600   RBC 4.08 05/25/2017 1158   HGB 12.7 05/12/2017 0600   HGB 14.6 05/17/2014 0938   HCT 39.1 05/25/2017 1158   PLT 314 05/25/2017 1158   MCV 95.8 05/25/2017 1158   MCH 32.4 05/25/2017 1158   MCHC 33.8 05/25/2017 1158   RDW 14.4 05/25/2017 1158   LYMPHSABS 1.4 05/25/2017 1158   MONOABS 0.7 05/25/2017 1158   EOSABS 0.0 05/25/2017 1158   BASOSABS 0.0 05/25/2017 1158   CMP Latest Ref Rng & Units 05/25/2017 05/12/2017 05/03/2017  Glucose  70 - 140 mg/dL 96 104(H) 84  BUN 7 - 26 mg/dL 7 9 7   Creatinine 0.60 - 1.10 mg/dL 0.77 0.85 0.80  Sodium 136 - 145 mmol/L 138 137 138  Potassium 3.5 - 5.1 mmol/L 3.3(L) 4.0 4.0  Chloride 98 - 109 mmol/L 104 105 106  CO2 22 - 29 mmol/L 24 26 26   Calcium 8.4 - 10.4 mg/dL 9.4 8.4(L) 8.8(L)  Total Protein 6.4 - 8.3 g/dL 7.5 - 7.4  Total Bilirubin 0.2 - 1.2 mg/dL 1.2 - 0.6  Alkaline Phos 40 - 150 U/L 87 - 54  AST 5 - 34 U/L 12 - 14(L)  ALT 0 - 55 U/L 14 - 13(L)

## 2017-05-25 NOTE — Telephone Encounter (Signed)
Received after hours note regarding the patient having abdominal pain. Patient called the office this morning. Patient stated " I'm still having abdominal pain this morning, thought I was constipated. But  I had a decent bowel movement this morning and still having pain. I weighed 147 lbs on Saturday and today I weigh 142 lbs. The pain is at a 25. The pain is in my lower abdomen down near my pelvic area. But then can go up to my diaphragm. The pain is located on both sides of my abdomen. It started Thursday night. I was fine until Thursday.I went out and did some things, and started hurting that night. I thought I may have just over done it, but not now, since I'm still having pain.  I am having some nausea but not fever or vomiting." Told the patient I will discuss this with Melissa APP and call her back.  Discuss the patient's symptoms with Melissa APP. Per Melissa APP patient to have an abdomen xray and an appt to see her. Called and spoke with the patient, patient verbalized understanding and is on her way.

## 2017-05-25 NOTE — Progress Notes (Signed)
Patient called reporting moderate abd pain, nausea.  Reports having a hard BM this am.  Abd 2 view ordered prior to her appt.

## 2017-05-26 ENCOUNTER — Telehealth: Payer: Self-pay | Admitting: Gynecologic Oncology

## 2017-05-26 ENCOUNTER — Other Ambulatory Visit: Payer: Self-pay | Admitting: Gynecologic Oncology

## 2017-05-26 DIAGNOSIS — G8918 Other acute postprocedural pain: Secondary | ICD-10-CM

## 2017-05-26 LAB — URINE CULTURE: CULTURE: NO GROWTH

## 2017-05-26 MED ORDER — HYDROCODONE-ACETAMINOPHEN 5-300 MG PO TABS: 1.0000 | ORAL_TABLET | Freq: Four times a day (QID) | ORAL | 0 refills | Status: DC | PRN

## 2017-05-26 NOTE — Telephone Encounter (Signed)
Called and discussed CT scan results.  Informed of pulmonary nodules that will need repeat imaging.  Advised that Dr. Denman George had reviewed her scans and labs.  No further recommendations at this time.  She is to monitor her pain and call for any changes.  I will follow up with the patient in the am.

## 2017-05-26 NOTE — Progress Notes (Signed)
Returned call to patient.  Advised that Dr. Denman George feels that since we do not know the cause of her faintness and shakiness from last pm, it is best if she avoids use of oxycodone.  Patient stating she has taken hydrocodone in the past and tolerated well.  Refill to be given for hydrocodone.  Advised to take only one at first and see how she reacts to the pain medication. She is to call later today with an update on her discomfort.  Reportable signs and symptoms reviewed.

## 2017-05-26 NOTE — Telephone Encounter (Signed)
Returned call to patient.  She called this am to discuss an episode she experienced last pm.  She stated she took an oxycodone around 7 pm and it took her pain away.  Around 11 pm she felt like she blacked out, felt shaky, rapid heartrate, lost color in her face.  She states she went to the bathroom after feeling somewhat better and had a soft BM. She states she was going to call 911 but she started to feel better.  She states she feels better this am.  Asking about refill on pain medication.  Advised that the situation would be discussed with Dr. Denman George and she would receive a call back.

## 2017-05-27 ENCOUNTER — Telehealth: Payer: Self-pay | Admitting: Gynecologic Oncology

## 2017-05-27 DIAGNOSIS — N739 Female pelvic inflammatory disease, unspecified: Secondary | ICD-10-CM | POA: Insufficient documentation

## 2017-05-27 MED ORDER — METRONIDAZOLE 500 MG PO TABS: 500.0000 mg | ORAL_TABLET | Freq: Three times a day (TID) | ORAL | 1 refills | Status: DC

## 2017-05-27 MED ORDER — CIPROFLOXACIN HCL 250 MG PO TABS: 250.0000 mg | ORAL_TABLET | Freq: Two times a day (BID) | ORAL | 1 refills | Status: DC

## 2017-05-27 MED ORDER — SIMETHICONE 80 MG PO CHEW: 80.0000 mg | CHEWABLE_TABLET | Freq: Four times a day (QID) | ORAL | 0 refills | Status: DC | PRN

## 2017-05-27 MED ORDER — FLUCONAZOLE 100 MG PO TABS: 100.0000 mg | ORAL_TABLET | Freq: Every day | ORAL | 1 refills | Status: DC

## 2017-05-27 NOTE — Telephone Encounter (Signed)
Patient has pain with bowel movements and passing gas. Draining small amount of old blood from vagina.   Possible inflammation in pelvis, though no frank infection on labs. Will try empiric flagyl and cipro. Will prescribe diflucan in case develops yeast infection. Prescribed simethicone for gas.  Donaciano Eva, MD

## 2017-05-29 ENCOUNTER — Emergency Department (HOSPITAL_COMMUNITY)
Admission: EM | Admit: 2017-05-29 | Discharge: 2017-05-29 | Disposition: A | Discharge status: Home / Self Care | Payer: 59 | Attending: Emergency Medicine | Admitting: Emergency Medicine

## 2017-05-29 ENCOUNTER — Other Ambulatory Visit: Payer: Self-pay

## 2017-05-29 ENCOUNTER — Emergency Department (HOSPITAL_COMMUNITY): Payer: 59

## 2017-05-29 ENCOUNTER — Encounter (HOSPITAL_COMMUNITY): Payer: Self-pay | Admitting: Emergency Medicine

## 2017-05-29 DIAGNOSIS — Z8541 Personal history of malignant neoplasm of cervix uteri: Secondary | ICD-10-CM | POA: Insufficient documentation

## 2017-05-29 DIAGNOSIS — F419 Anxiety disorder, unspecified: Secondary | ICD-10-CM | POA: Insufficient documentation

## 2017-05-29 DIAGNOSIS — R109 Unspecified abdominal pain: Secondary | ICD-10-CM | POA: Diagnosis present

## 2017-05-29 DIAGNOSIS — Z79899 Other long term (current) drug therapy: Secondary | ICD-10-CM | POA: Insufficient documentation

## 2017-05-29 DIAGNOSIS — R102 Pelvic and perineal pain: Secondary | ICD-10-CM | POA: Diagnosis not present

## 2017-05-29 DIAGNOSIS — F17210 Nicotine dependence, cigarettes, uncomplicated: Secondary | ICD-10-CM | POA: Diagnosis not present

## 2017-05-29 DIAGNOSIS — N898 Other specified noninflammatory disorders of vagina: Secondary | ICD-10-CM | POA: Diagnosis not present

## 2017-05-29 DIAGNOSIS — E039 Hypothyroidism, unspecified: Secondary | ICD-10-CM | POA: Diagnosis not present

## 2017-05-29 DIAGNOSIS — F329 Major depressive disorder, single episode, unspecified: Secondary | ICD-10-CM | POA: Insufficient documentation

## 2017-05-29 LAB — CBC WITH DIFFERENTIAL/PLATELET
Basophils Absolute: 0 10*3/uL (ref 0.0–0.1)
Basophils Relative: 0 %
EOS PCT: 1 %
Eosinophils Absolute: 0.1 10*3/uL (ref 0.0–0.7)
HCT: 38.6 % (ref 36.0–46.0)
Hemoglobin: 13 g/dL (ref 12.0–15.0)
LYMPHS ABS: 1.6 10*3/uL (ref 0.7–4.0)
LYMPHS PCT: 25 %
MCH: 32.4 pg (ref 26.0–34.0)
MCHC: 33.7 g/dL (ref 30.0–36.0)
MCV: 96.3 fL (ref 78.0–100.0)
MONO ABS: 0.5 10*3/uL (ref 0.1–1.0)
Monocytes Relative: 7 %
Neutro Abs: 4.3 10*3/uL (ref 1.7–7.7)
Neutrophils Relative %: 67 %
PLATELETS: 474 10*3/uL — AB (ref 150–400)
RBC: 4.01 MIL/uL (ref 3.87–5.11)
RDW: 14 % (ref 11.5–15.5)
WBC: 6.5 10*3/uL (ref 4.0–10.5)

## 2017-05-29 LAB — COMPREHENSIVE METABOLIC PANEL
ALT: 13 U/L — ABNORMAL LOW (ref 14–54)
ANION GAP: 11 (ref 5–15)
AST: 14 U/L — ABNORMAL LOW (ref 15–41)
Albumin: 3.7 g/dL (ref 3.5–5.0)
Alkaline Phosphatase: 88 U/L (ref 38–126)
BUN: 8 mg/dL (ref 6–20)
CHLORIDE: 102 mmol/L (ref 101–111)
CO2: 26 mmol/L (ref 22–32)
CREATININE: 0.71 mg/dL (ref 0.44–1.00)
Calcium: 8.7 mg/dL — ABNORMAL LOW (ref 8.9–10.3)
Glucose, Bld: 85 mg/dL (ref 65–99)
POTASSIUM: 3.1 mmol/L — AB (ref 3.5–5.1)
Sodium: 139 mmol/L (ref 135–145)
Total Bilirubin: 0.6 mg/dL (ref 0.3–1.2)
Total Protein: 7.6 g/dL (ref 6.5–8.1)

## 2017-05-29 LAB — WET PREP, GENITAL
CLUE CELLS WET PREP: NONE SEEN
Sperm: NONE SEEN
TRICH WET PREP: NONE SEEN
YEAST WET PREP: NONE SEEN

## 2017-05-29 MED ORDER — IOPAMIDOL (ISOVUE-300) INJECTION 61%
INTRAVENOUS | Status: AC
  Administered 2017-05-29: 100 mL
  Filled 2017-05-29: qty 100

## 2017-05-29 NOTE — Discharge Instructions (Signed)
Your evaluated in the emergency department for pelvic pain after hysterectomy.  You had a CT and an ultrasound and you were evaluated by your gynecologist Dr. Denman George.  She felt you were okay to go home and need to follow-up with her in the clinic as scheduled.  You should continue to take the antibiotics that she prescribed.  Please return if any worsening symptoms.

## 2017-05-29 NOTE — Consult Note (Signed)
ER physicians notified me of patient's presence in ER.  S: patient reports gushes of fluid from vagina over past 24 hours. Pain the same (mostly with movement of colon - passage of gas or BM). Fluid is pink tinged, thin. No fevers.   O: BP 123/76 (BP Location: Right Arm)   Pulse 78   Temp 98.8 F (37.1 C)   Resp 18   LMP 04/26/2017 Comment: hysterectomy  SpO2 100%   CBC    Component Value Date/Time   WBC 6.5 05/29/2017 1207   RBC 4.01 05/29/2017 1207   HGB 13.0 05/29/2017 1207   HGB 14.6 05/17/2014 0938   HCT 38.6 05/29/2017 1207   PLT 474 (H) 05/29/2017 1207   PLT 314 05/25/2017 1158   MCV 96.3 05/29/2017 1207   MCH 32.4 05/29/2017 1207   MCHC 33.7 05/29/2017 1207   RDW 14.0 05/29/2017 1207   LYMPHSABS 1.6 05/29/2017 1207   MONOABS 0.5 05/29/2017 1207   EOSABS 0.1 05/29/2017 1207   BASOSABS 0.0 05/29/2017 1207    BMET    Component Value Date/Time   NA 139 05/29/2017 1207   K 3.1 (L) 05/29/2017 1207   CL 102 05/29/2017 1207   CO2 26 05/29/2017 1207   GLUCOSE 85 05/29/2017 1207   BUN 8 05/29/2017 1207   CREATININE 0.71 05/29/2017 1207   CREATININE 0.83 05/17/2014 1302   CALCIUM 8.7 (L) 05/29/2017 1207   GFRNONAA >60 05/29/2017 1207   GFRAA >60 05/29/2017 1207    Patient appears in no distress at rest. Abdomen soft, nondistended, nonsurgical Pelvic exam: normal external genitalia. No blood or fluid in vagina on speculum exam. Vaginal cuff in tact with no bleeding. Palpably in tact throughout. Tender to palpate, but no palpable abscess. No erythema of cuff on inspection.  TVUS: fluid in pelvis, no other abnormalities.  Impression and plan:  Patient has pelvic fluid collection post hysterectomy. There is no apparent cuff dehiscence or separation. There is no active drainage on exam. Clinically, there is no sign of abscess or infected collection.  CT urogram earlier this week did not show urologic injury, but given the new symptoms of leakage and the patient's  distress at her symptoms, I believe it is reasonable to repeat and rule this out again.  If negative, she appears well enough to be discharged. If positive for urologic leak, we will consult urology and determine if management should take place as an inpatient issue vs outpatient.  The patient is clearly frustrated at her slow rate at healing. She feels that she "should be feeling normal by 3 weeks out".  I explained that she had major surgery, but as yet, we have failed to identify a major sequelae/complication. Therefore my default diagnosis is normal postoperative healing.  Donaciano Eva, MD

## 2017-05-29 NOTE — ED Notes (Signed)
Took out IV per pt and MD request. RN aware.

## 2017-05-29 NOTE — ED Notes (Signed)
Dr Ledora Bottcher in to evaluate pt

## 2017-05-29 NOTE — ED Notes (Signed)
Vaginal [place] in process.

## 2017-05-29 NOTE — ED Provider Notes (Signed)
[facility] DEPT Provider Note   CSN: [id number] Arrival date & time: 05/29/17  1207     History   Chief Complaint No chief complaint on file.   HPI [place] is a 37 y.o. female.  HPI   Patient is a 37 year old female with past medical history significant for abnormal Pap smear, cervical cancer.  Status post complete hysterectomy with cervical cough completed on February 6.  Since the surgery patient had a abdomen.  Patient was seen in follow-up 1 week ago had normal lab work and CT abdomen pelvis with contrast.  Patient continued to have discomfort, now has some discharge from her vaginal vault.  Sent here by her surgeon, Dr. Denman George to evaluate for vaginal cuff issue.  Past Medical History:  Diagnosis Date  . Abnormal Pap smear of cervix 06/2009   pos HR HPV with colpo--"precancerous cells" found per pt. 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 Anxiety    prescribed zoloft not taking  . Bronchitis    hx of   . Cancer ([place])    abnormal pap smear  . Depression   . GERD (gastroesophageal reflux disease)    hx of   . Hypothyroidism    no meds back to normal per patient  . Thyroid disease    middle school, returned to normal after childbirth    Patient Active Problem List   Diagnosis Date Noted  . Pelvic inflammation in female 05/27/2017  . Cervical cancer ([facility]) 05/11/2017  . Status post repeat low transverse cesarean section 11/29/2015  . Hx of cesarean section 08/28/2015  . History of miscarriage, currently pregnant 08/28/2015  . Anxiety disorder 08/28/2015  . Hypothyroidism 08/28/2015  . Vitamin D deficiency 08/28/2015  . Rubella non-immune status, antepartum 08/28/2015  . GERD (gastroesophageal reflux disease) 08/28/2015  . Abnormal Pap smear of cervix 06/04/2009    Past Surgical History:  Procedure Laterality Date  . BILATERAL SALPINGECTOMY Bilateral 05/11/2017   Procedure: BILATERAL SALPINGECTOMY;   Surgeon: Everitt Amber, MD;  Location: [facility];  Service: Gynecology;  Laterality: Bilateral;  . CERVICAL CONIZATION W/BX N/A 03/23/2017   Procedure: CONIZATION CERVIX WITH BIOPSY;  Surgeon: Everett Graff, MD;  Location: [facility];  Service: Gynecology;  Laterality: N/A;  . CESAREAN SECTION  10/20/02  . CESAREAN SECTION N/A 11/29/2015   Procedure: CESAREAN SECTION;  Surgeon: Everett Graff, MD;  Location: [facility];  Service: Obstetrics;  Laterality: N/A;  RNFA requested - Heather K  . COLPOSCOPY  06/2009   "precancerous cells" on biopsy  . INTRAUTERINE DEVICE INSERTION  06/14/09   Mirena  . IUD REMOVAL N/A 03/23/2017   Procedure: INTRAUTERINE DEVICE (IUD) REMOVAL;  Surgeon: Everett Graff, MD;  Location: [facility];  Service: Gynecology;  Laterality: N/A;  . ROBOTIC ASSISTED TOTAL HYSTERECTOMY N/A 05/11/2017   Procedure: ROBOTIC ASSISTED TOTAL HYSTERECTOMY;  Surgeon: Everitt Amber, MD;  Location: [facility];  Service: Gynecology;  Laterality: N/A;  . SENTINEL NODE BIOPSY N/A 05/11/2017   Procedure: SENTINEL NODE BIOPSY;  Surgeon: Everitt Amber, MD;  Location: [facility];  Service: Gynecology;  Laterality: N/A;  . TONSILLECTOMY    . TONSILLECTOMY AND ADENOIDECTOMY  1992    OB History    Gravida Para Term Preterm AB Living   3 2 2   1 2    SAB TAB Ectopic Multiple Live Births   1     0 2       Home Medications  Prior to Admission medications   Medication Sig Start Date End Date Taking? Authorizing Provider  acetaminophen (TYLENOL) 500 MG tablet Take 2 tablets (1,000 mg total) by mouth every 6 (six) hours. 05/12/17   Everitt Amber, MD  albuterol (PROVENTIL HFA;VENTOLIN HFA) 108 (90 Base) MCG/ACT inhaler Inhale 1-2 puffs into the lungs every 6 (six) hours as needed for wheezing or shortness of breath.    [provider]  amoxicillin-clavulanate (AUGMENTIN) 875-125 MG tablet Take 1 tablet by mouth 2 (two) times daily. 05/04/17   Kennyth Arnold, FNP  ciprofloxacin (CIPRO) 250 MG tablet Take 1  tablet (250 mg total) by mouth 2 (two) times daily. 05/27/17   Everitt Amber, MD  clotrimazole-betamethasone (LOTRISONE) cream Apply 1 application topically 2 (two) times daily as needed. For irritation. 04/07/17   [provider]  cyclobenzaprine (FLEXERIL) 10 MG tablet Take 1 tablet (10 mg total) by mouth 3 (three) times daily as needed for muscle spasms. Patient not taking: Reported on 05/25/2017 05/15/17   Lahoma Crocker, MD  famotidine (PEPCID) 20 MG tablet Take 1 tablet (20 mg total) by mouth 2 (two) times daily. Patient taking differently: Take 20 mg by mouth 2 (two) times daily as needed (for heartburn/indigestion.).  11/03/15   Waymon Amato, MD  fluconazole (DIFLUCAN) 100 MG tablet Take 1 tablet (100 mg total) by mouth daily. 05/27/17   Everitt Amber, MD  Hydrocodone-Acetaminophen (VICODIN) 5-300 MG TABS Take 1 tablet by mouth every 6 (six) hours as needed. 05/26/17   Cross, Lenna Sciara D, NP  ibuprofen (ADVIL,MOTRIN) 600 MG tablet Take 1 tablet (600 mg total) by mouth every 6 (six) hours as needed. Patient taking differently: Take 600 mg by mouth every 6 (six) hours as needed (for pain.).  03/23/17   Everett Graff, MD  metroNIDAZOLE (FLAGYL) 500 MG tablet Take 1 tablet (500 mg total) by mouth 3 (three) times daily. 05/27/17   Everitt Amber, MD  senna-docusate (SENOKOT-S) 8.6-50 MG tablet Take 2 tablets by mouth at bedtime. 05/12/17   Everitt Amber, MD  sertraline (ZOLOFT) 25 MG tablet Take 25 mg by mouth daily.    [provider]  simethicone (GAS-X) 80 MG chewable tablet Chew 1 tablet (80 mg total) by mouth every 6 (six) hours as needed for flatulence. 05/27/17   Everitt Amber, MD    Family History Family History  Problem Relation Age of Onset  . Stroke Father 62       blocked carotid artery, left side  . Emphysema Maternal Grandmother        smoker  . Stroke Maternal Grandfather 58  . Dementia Paternal Grandmother   . Cancer Paternal Grandfather        lung, smoker?    Social  History Social History   Tobacco Use  . Smoking status: Current Every Day Smoker    Packs/day: 1.00    Years: 16.00    Pack years: 16.00    Types: Cigarettes  . Smokeless tobacco: Never Used  Substance Use Topics  . Alcohol use: No    Alcohol/week: 0.0 oz  . Drug use: No     Allergies   Patient has no known allergies.   Review of Systems Review of Systems  Constitutional: Negative for activity change.  Respiratory: Negative for shortness of breath.   Cardiovascular: Negative for chest pain.  Gastrointestinal: Negative for abdominal pain.  Genitourinary: Positive for vaginal discharge and vaginal pain.  All other systems reviewed and are negative.    Physical Exam Updated  Vital Signs BP 131/68   Pulse 82   Temp 98.8 F (37.1 C)   Resp 16   SpO2 100%   Physical Exam  Constitutional: She is oriented to person, place, and time. She appears well-developed and well-nourished.  HENT:  Head: Normocephalic and atraumatic.  Eyes: Right eye exhibits no discharge. Left eye exhibits no discharge.  Cardiovascular: Normal rate, regular rhythm and normal heart sounds.  No murmur heard. Pulmonary/Chest: Effort normal and breath sounds normal. She has no wheezes. She has no rales.  Abdominal: Soft. She exhibits no distension. There is no tenderness.  Genitourinary: Vagina normal.  Genitourinary Comments: Vaginal exam shows normal-appearing cuff.  No evidence of discharge.  No erythema, no tears noted no abnormal liquid in the vault.    Neurological: She is oriented to person, place, and time.  Skin: Skin is warm and dry. She is not diaphoretic.  Psychiatric: She has a normal mood and affect.  Nursing note and vitals reviewed.    ED Treatments / Results  Labs (all labs ordered are listed, but only abnormal results are displayed) Labs Reviewed - No data to display  EKG  EKG Interpretation None       Radiology No results found.  Procedures Procedures (including  critical care time)  Medications Ordered in ED Medications - No data to display   Initial Impression / Assessment and Plan / ED Course  I have reviewed the triage vital signs and the nursing notes.  Pertinent labs & imaging results that were available during my care of the patient were reviewed by me and considered in my medical decision making (see chart for details).  Clinical Course as of May 30 1598  Sat May 29, 2017  1539 Signout from Dr Thomasene Lot - s/p hys for cancer, with increased vaginal pain. Awaiting her gynecologist who is coming to ED for evaluation.   [MB]  5009 Dr. Denman George had wanted the patient to get a CT to follow-up on the fluid noted on the ultrasound.  The CAT scan is resulted and I reviewed the findings with Dr. Denman George.  She feels the patient could be safely discharged to follow-up with her in the office.  She states she is already given her prescription for Cipro and Flagyl and would instruct patient to start taking those.  [MB]    Clinical Course User Index [MB] Hayden Rasmussen, MD    Patient is a 37 year old female with past medical history significant for abnormal Pap smear, cervical cancer.  Status post complete hysterectomy with cervical cough completed on February 6.  Since the surgery patient had a abdomen.  Patient was seen in follow-up 1 week ago had normal lab work and CT abdomen pelvis with contrast.  Patient continued to have discomfort, now has some discharge from her vaginal vault.  Sent here by her surgeon, Dr. Denman George to evaluate for vaginal cuff issue.  ON exam cuff appears normal, she had no pain on exam, wlll get TVUS to better evaluate.   Called Dr. Denman George and left a message.   2:50 PM Discussed with Dr. Denman George.  She is on her way here by car.  She will personally evaluate patient and decide next actions.  Final Clinical Impressions(s) / ED Diagnoses   Final diagnoses:  None    ED Discharge Orders    None       Barri Neidlinger, Fredia Sorrow,  MD 05/30/17 1601

## 2017-05-29 NOTE — ED Triage Notes (Signed)
Pt sent from [facility] post hysterectomy for cervical cancer on 2/5, with severe abd pain, vaginal pain, no relief from vicodin. Vaginal bleeding/discharge is yellow with mucous streaked with blood. Cervix was removed with vaginal cuff. Pt states this has been going on since surgery. Dr. Rozell Searing she may need to go back to surgery for intervention.

## 2017-05-31 LAB — GC/CHLAMYDIA PROBE AMP (~~LOC~~) NOT AT ARMC
CHLAMYDIA, DNA PROBE: NEGATIVE
NEISSERIA GONORRHEA: NEGATIVE

## 2017-06-09 NOTE — Progress Notes (Signed)
Follow Up Note: Gyn-Onc  Sara Shannon 37 y.o. female  CC:  Chief Complaint  Patient presents with  . Cervical Cancer  . pelvic collection postop   Assessment/Plan: 37 year old female s/p robotic-assisted laparoscopic total hysterectomy with bilateral salpingectomy, SLN biopsy on 05/11/17 for stage IA1 cervical cancer (no residual carcinoma on specimen).  Postoperatively she developed a pelvic lymphocyst/seroma which was draining and causing discomfort.  Pelvic seroma: continuing to drain bloody fluid. No fever or white count. Will re-evaluate with scan. If significant residual fluid, will plan to have IR evaluate for percutaneous drainage.   Cervical cancer: Sara has a low risk cancer and adjuvant therapy is not necessary. I am recommending annual pap and HPV surveillance with Dr Mancel Bale. 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  HPI: Sara Shannon is a 37 year old female, G2P2, initially referred by Dr. Everett Graff for cervical cancer.  She underwent a cold knife conization on March 23, 2017 resulting: Diagnosis 1. 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 - THERE IS NO EVIDENCE OF HYPERPLASIA OR MALIGNANCY. 2. 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 - THE SURGICAL RESECTION MARGINS ARE NEGATIVE FOR CARCINOMA.  In the comments of her pathology the depth of invasion was 2 mm in depth of the cervical specimen measured 6 mm. The horizontal extent was 3 mm. There was no lymphovascular space involvement noted. Therefore she has a tumor consistent with a 1 A1 cervix cancer. Therefore she has a tumor consistent with a 1 A1 cervix cancer.   Her past gynecologic history includes cervical biopsies which revealed high-grade dysplasia with one cervical biopsy at 12:00 revealed CIN-3 and could not  rule out focal microinvasion. ECC was negative and endometrial biopsy was inactive endometrium with progestational changes. There is no hyperplasia or malignancy. Her first abnormal pap was at the age of 22 and she has intermittent normal pap smears but they've often been abnormal. Denies receiving HPV vaccine.  On 05/11/2017, she underwent a robotic-assisted laparoscopic total hysterectomy with bilateral salpingectomy, SLN biopsy with Dr. Everitt Amber. Her post-operative course was uneventful.  Final path resulted and showed no residual carcinoma:  Diagnosis 1. 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 Lymph node, sentinel, biopsy, right obturator - NO CARCINOMA IDENTIFIED IN THREE LYMPH NODES (0/3) - SEE COMMENT 3. Lymph node, sentinel, biopsy, left external iliac #1 - NO CARCINOMA IDENTIFIED IN ONE LYMPH NODE (0/1) - SEE COMMENT 4. Lymph node, sentinel, biopsy, left external iliac #2 - NO CARCINOMA IDENTIFIED IN ONE LYMPH NODE (0/1) - SEE COMMENT 5. Lymph node, sentinel, biopsy, left obturator - NO CARCINOMA IDENTIFIED IN ONE LYMPH NODE (0/1) - SEE COMMENT 6. Omentum, biopsy - NO CARCINOMA IDENTIFIED    Interval History:  She developed signficant pain postop and was seen in the office and ED on multiple occasions including 2 CT scans. The second CT scan on 05/29/17 showed a 6cm pelvic fluid collection with enhancing wall. She was diagnosed as having a pelvic fluid collection without signs of infection. Given that it was spontaneously draining, no additional therapy was recommended.  Review of Systems: Constitutional: Feels  poorly.  Severe abdominal pain.  Intermittent chills with no documented fever.   Cardiovascular: No chest pain, shortness of breath, or edema.  Pulmonary: No cough or wheeze.  Gastrointestinal: Positive for nausea and constipation. No vomiting or diarrhea. No bright red blood per rectum.  Genitourinary: Abdominal pain worsens prior to urination. No frequency, urgency. No vaginal bleeding or discharge.  Musculoskeletal: No myalgia or joint pain. Neurologic: No weakness, numbness, or change in gait.  Psychology: No depression, anxiety, or insomnia.  Current Meds:  Outpatient Encounter Medications as of 06/10/2017  Medication Sig  . acetaminophen (TYLENOL) 500 MG tablet Take 2 tablets (1,000 mg total) by mouth every 6 (six) hours.  Marland Kitchen albuterol (PROVENTIL HFA;VENTOLIN HFA) 108 (90 Base) MCG/ACT inhaler Inhale 1-2 puffs into the lungs every 6 (six) hours as needed for wheezing or shortness of breath.  . ciprofloxacin (CIPRO) 250 MG tablet Take 1 tablet (250 mg total) by mouth 2 (two) times daily.  . clotrimazole-betamethasone (LOTRISONE) cream Apply 1 application topically 2 (two) times daily as needed. For irritation.  . famotidine (PEPCID) 20 MG tablet Take 1 tablet (20 mg total) by mouth 2 (two) times daily. (Patient taking differently: Take 20 mg by mouth 2 (two) times daily as needed (for heartburn/indigestion.). )  . ibuprofen (ADVIL,MOTRIN) 600 MG tablet Take 1 tablet (600 mg total) by mouth every 6 (six) hours as needed. (Patient taking differently: Take 600 mg by mouth every 6 (six) hours as needed (for pain.). )  . metroNIDAZOLE (FLAGYL) 500 MG tablet Take 1 tablet (500 mg total) by mouth 3 (three) times daily.  . sertraline (ZOLOFT) 25 MG tablet Take 25 mg by mouth daily.  . simethicone (GAS-X) 80 MG chewable tablet Chew 1 tablet (80 mg total) by mouth every 6 (six) hours as needed for flatulence.  . cyclobenzaprine (FLEXERIL) 10 MG tablet Take 1 tablet (10 mg total) by mouth 3 (three) times daily as needed for muscle spasms. (Patient not taking: Reported on 05/25/2017)   . fluconazole (DIFLUCAN) 100 MG tablet Take 1 tablet (100 mg total) by mouth daily. (Patient not taking: Reported on 06/10/2017)  . Hydrocodone-Acetaminophen (VICODIN) 5-300 MG TABS Take 1 tablet by mouth every 6 (six) hours as needed. (Patient not taking: Reported on 06/10/2017)  . [DISCONTINUED] amoxicillin-clavulanate (AUGMENTIN) 875-125 MG tablet Take 1 tablet by mouth 2 (two) times daily.  . [DISCONTINUED] senna-docusate (SENOKOT-S) 8.6-50 MG tablet Take 2 tablets by mouth at bedtime.   No facility-administered encounter medications on file as of 06/10/2017.     Allergy: No Known Allergies  Social Hx:   Social History   Socioeconomic History  . Marital status: Married    Spouse name: Not on file  . Number of children: 1  . Years of education: Not on file  . Highest education level: Not on file  Social Needs  . Financial resource strain: Not on file  . Food insecurity - worry: Not on file  . Food insecurity - inability: Not on file  . Transportation needs - medical: Not on file  . Transportation needs - non-medical: Not on file  Occupational History  . Not on file  Tobacco Use  . Smoking status: Current Every Day Smoker    Packs/day: 1.00    Years: 16.00    Pack years: 16.00    Types: Cigarettes  . Smokeless tobacco: Never Used  Substance and Sexual Activity  . Alcohol use: No    Alcohol/week: 0.0 oz  . Drug use: No  .  Sexual activity: Yes    Birth control/protection: None  Other Topics Concern  . Not on file  Social History Narrative  . Not on file    Past Surgical Hx:  Past Surgical History:  Procedure Laterality Date  . BILATERAL SALPINGECTOMY Bilateral 05/11/2017   Procedure: BILATERAL SALPINGECTOMY;  Surgeon: Everitt Amber, MD;  Location: [facility];  Service: Gynecology;  Laterality: Bilateral;  . CERVICAL CONIZATION W/BX N/A 03/23/2017   Procedure: CONIZATION CERVIX WITH BIOPSY;  Surgeon: Everett Graff, MD;  Location: [facility];  Service: Gynecology;  Laterality:  N/A;  . CESAREAN SECTION  10/20/02  . CESAREAN SECTION N/A 11/29/2015   Procedure: CESAREAN SECTION;  Surgeon: Everett Graff, MD;  Location: [facility];  Service: Obstetrics;  Laterality: N/A;  RNFA requested - Heather K  . COLPOSCOPY  06/2009   "precancerous cells" on biopsy  . INTRAUTERINE DEVICE INSERTION  06/14/09   Mirena  . IUD REMOVAL N/A 03/23/2017   Procedure: INTRAUTERINE DEVICE (IUD) REMOVAL;  Surgeon: Everett Graff, MD;  Location: [facility];  Service: Gynecology;  Laterality: N/A;  . ROBOTIC ASSISTED TOTAL HYSTERECTOMY N/A 05/11/2017   Procedure: ROBOTIC ASSISTED TOTAL HYSTERECTOMY;  Surgeon: Everitt Amber, MD;  Location: [facility];  Service: Gynecology;  Laterality: N/A;  . SENTINEL NODE BIOPSY N/A 05/11/2017   Procedure: SENTINEL NODE BIOPSY;  Surgeon: Everitt Amber, MD;  Location: [facility];  Service: Gynecology;  Laterality: N/A;  . TONSILLECTOMY    . TONSILLECTOMY AND ADENOIDECTOMY  1992    Past Medical Hx:  Past Medical History:  Diagnosis Date  . Abnormal Pap smear of cervix 06/2009   pos HR HPV with colpo--"precancerous cells" found per pt. 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 Anxiety    prescribed zoloft not taking  . Bronchitis    hx of   . Cancer ([place])    abnormal pap smear  . Depression   . GERD (gastroesophageal reflux disease)    hx of   . Hypothyroidism    no meds back to normal per patient  . Thyroid disease    middle school, returned to normal after childbirth    Family Hx:  Family History  Problem Relation Age of Onset  . Stroke Father 15       blocked carotid artery, left side  . Emphysema Maternal Grandmother        smoker  . Stroke Maternal Grandfather 52  . Dementia Paternal Grandmother   . Cancer Paternal Grandfather        lung, smoker?    Vitals:  Blood pressure 128/73, pulse 75, temperature 98.5 F (36.9 C), temperature source Oral, resp. rate 18, height 5\' 5"  (1.651 m), weight 145 lb (65.8 kg), last menstrual  period 04/26/2017, SpO2 100 %, unknown if currently breastfeeding.  Physical Exam:  General: Well developed, well nourished female in mild distress related to abd pain. Alert and oriented x 3. Grimacing and guarding abdomen intermittently.  Cardiovascular: Regular rate and rhythm. S1 and S2 normal.  Lungs: Clear to auscultation bilaterally. No wheezes/crackles/rhonchi noted.  Skin: No rashes or lesions present. Back: No CVA tenderness.  Abdomen: soft, nondistended, nontender. Incisions well healed. Pelvic: vaginal cuff in tact, though there is old blood coming from the right vaginal fornix. Palpably in tact suture line.  Extremities: No bilateral cyanosis, edema, or clubbing.     Thereasa Solo, MD 06/10/2017, 3:58 PM

## 2017-06-10 ENCOUNTER — Inpatient Hospital Stay: Payer: 59

## 2017-06-10 ENCOUNTER — Inpatient Hospital Stay: Payer: 59 | Attending: Gynecologic Oncology | Admitting: Gynecologic Oncology

## 2017-06-10 ENCOUNTER — Encounter: Payer: Self-pay | Admitting: Gynecologic Oncology

## 2017-06-10 VITALS — BP 128/73 | HR 75 | Temp 98.5°F | Resp 18 | Ht 65.0 in | Wt 145.0 lb

## 2017-06-10 DIAGNOSIS — C539 Malignant neoplasm of cervix uteri, unspecified: Secondary | ICD-10-CM | POA: Insufficient documentation

## 2017-06-10 DIAGNOSIS — Z90710 Acquired absence of both cervix and uterus: Secondary | ICD-10-CM | POA: Diagnosis not present

## 2017-06-10 DIAGNOSIS — R5383 Other fatigue: Secondary | ICD-10-CM

## 2017-06-10 DIAGNOSIS — R188 Other ascites: Secondary | ICD-10-CM

## 2017-06-10 DIAGNOSIS — Z90722 Acquired absence of ovaries, bilateral: Secondary | ICD-10-CM | POA: Insufficient documentation

## 2017-06-10 LAB — CBC WITH DIFFERENTIAL (CANCER CENTER ONLY)
BASOS ABS: 0 10*3/uL (ref 0.0–0.1)
Basophils Relative: 1 %
EOS ABS: 0.1 10*3/uL (ref 0.0–0.5)
EOS PCT: 2 %
HCT: 42.4 % (ref 34.8–46.6)
HEMOGLOBIN: 14.1 g/dL (ref 11.6–15.9)
LYMPHS ABS: 2.5 10*3/uL (ref 0.9–3.3)
LYMPHS PCT: 37 %
MCH: 32.1 pg (ref 25.1–34.0)
MCHC: 33.3 g/dL (ref 31.5–36.0)
MCV: 96.6 fL (ref 79.5–101.0)
Monocytes Absolute: 0.5 10*3/uL (ref 0.1–0.9)
Monocytes Relative: 7 %
NEUTROS PCT: 53 %
Neutro Abs: 3.6 10*3/uL (ref 1.5–6.5)
PLATELETS: 385 10*3/uL (ref 145–400)
RBC: 4.39 MIL/uL (ref 3.70–5.45)
RDW: 14.6 % — ABNORMAL HIGH (ref 11.2–14.5)
WBC: 6.6 10*3/uL (ref 3.9–10.3)

## 2017-06-10 NOTE — Patient Instructions (Signed)
Dr Denman George will assess your fluid collection with CT scan. If it is substantial it will be evaluated by IR for possible drainage.  No intercourse until 07/09/17 and no more vaginal drainage.  You will need to follow-up with Dr Mancel Bale annually for pap testing due to your history of cervical cancer.

## 2017-06-11 LAB — TSH: TSH: 1.38 u[IU]/mL (ref 0.308–3.960)

## 2017-06-14 ENCOUNTER — Encounter: Payer: Self-pay | Admitting: Gynecologic Oncology

## 2017-06-14 ENCOUNTER — Ambulatory Visit (HOSPITAL_COMMUNITY)
Admission: RE | Admit: 2017-06-14 | Discharge: 2017-06-14 | Disposition: A | Discharge status: Home / Self Care | Payer: 59 | Source: Ambulatory Visit | Attending: Gynecologic Oncology | Admitting: Gynecologic Oncology

## 2017-06-14 DIAGNOSIS — R188 Other ascites: Secondary | ICD-10-CM | POA: Insufficient documentation

## 2017-06-14 DIAGNOSIS — C539 Malignant neoplasm of cervix uteri, unspecified: Secondary | ICD-10-CM | POA: Diagnosis not present

## 2017-06-14 DIAGNOSIS — R911 Solitary pulmonary nodule: Secondary | ICD-10-CM | POA: Diagnosis not present

## 2017-06-14 MED ORDER — SODIUM CHLORIDE 0.9 % IJ SOLN
INTRAMUSCULAR | Status: AC
  Filled 2017-06-14: qty 50

## 2017-06-14 MED ORDER — IOPAMIDOL (ISOVUE-300) INJECTION 61%
100.0000 mL | Freq: Once | INTRAVENOUS | Status: AC | PRN
  Administered 2017-06-14: 100 mL via INTRAVENOUS

## 2017-06-14 MED ORDER — IOPAMIDOL (ISOVUE-300) INJECTION 61%
INTRAVENOUS | Status: AC
  Filled 2017-06-14: qty 100

## 2017-06-15 ENCOUNTER — Telehealth: Payer: Self-pay | Admitting: Gynecologic Oncology

## 2017-06-15 NOTE — Telephone Encounter (Signed)
Called patient per her request on Mychart.  Patient stating she is very frustrated because she is still having vaginal bleeding.  When asking about the amount, she states "Dr. Denman George knows..the same as the other day."  She states she saw her CT scan results but she feels she should not still have bleeding and she wants something done before she goes back to work.  She states she knows several people who have had a hysterectomy who did not have bleeding.  "She needs to figure out what she is going to do to fix this."  Advised patient that her concerns will be discussed with Dr. Denman George and she would be contacted with her recommendations.  Advised her that we could fit her in tomorrow in clinic for examination and she states she is "racking up all these bills" and she just had her vaginal cuff checked last week.  Advised patient that we would reach back out to her with Dr. Serita Grit recommendations.

## 2017-06-17 ENCOUNTER — Encounter: Payer: Self-pay | Admitting: Gynecologic Oncology

## 2017-08-02 ENCOUNTER — Emergency Department
Admission: EM | Admit: 2017-08-02 | Discharge: 2017-08-02 | Disposition: A | Discharge status: Home / Self Care | Payer: 59 | Source: Home / Self Care

## 2017-08-02 ENCOUNTER — Encounter: Payer: Self-pay | Admitting: *Deleted

## 2017-08-02 DIAGNOSIS — J029 Acute pharyngitis, unspecified: Secondary | ICD-10-CM | POA: Diagnosis not present

## 2017-08-02 LAB — POCT RAPID STREP A (OFFICE): Rapid Strep A Screen: NEGATIVE

## 2017-08-02 NOTE — ED Triage Notes (Signed)
Pt presents today c/o sore throat and nausea x4 days. She states this began three days ago. Her daughter attends day care and has been exposed to Strep.

## 2017-08-02 NOTE — Discharge Instructions (Signed)
Warm salt water gargles,

## 2017-08-03 ENCOUNTER — Telehealth: Payer: Self-pay

## 2017-08-03 LAB — STREP A DNA PROBE: Group A Strep Probe: NOT DETECTED

## 2017-08-03 NOTE — ED Provider Notes (Signed)
[name]    CSN: [id number] Arrival date & time: 08/02/17  0945     History   Chief Complaint Chief Complaint  Patient presents with  . Sore Throat    HPI Sara Shannon is a 37 y.o. female.   The history is provided by the patient. No language interpreter was used.  Sore Throat  This is a new problem. Episode onset: 4 days. The problem occurs constantly. The problem has been rapidly worsening. Pertinent negatives include no headaches. Nothing aggravates the symptoms. Nothing relieves the symptoms. She has tried nothing for the symptoms. The treatment provided no relief.    Past Medical History:  Diagnosis Date  . Abnormal Pap smear of cervix 06/2009   pos HR HPV with colpo--"precancerous cells" found per pt. 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 Anxiety    prescribed zoloft not taking  . Bronchitis    hx of   . Cancer ([place])    abnormal pap smear  . Depression   . GERD (gastroesophageal reflux disease)    hx of   . Hypothyroidism    no meds back to normal per patient  . Thyroid disease    middle school, returned to normal after childbirth    Patient Active Problem List   Diagnosis Date Noted  . Pelvic inflammation in female 05/27/2017  . Cervical cancer ([place]) 05/11/2017  . Status post repeat low transverse cesarean section 11/29/2015  . Hx of cesarean section 08/28/2015  . History of miscarriage, currently pregnant 08/28/2015  . Anxiety disorder 08/28/2015  . Hypothyroidism 08/28/2015  . Vitamin D deficiency 08/28/2015  . Rubella non-immune status, antepartum 08/28/2015  . GERD (gastroesophageal reflux disease) 08/28/2015  . Abnormal Pap smear of cervix 06/04/2009    Past Surgical History:  Procedure Laterality Date  . BILATERAL SALPINGECTOMY Bilateral 05/11/2017   Procedure: BILATERAL SALPINGECTOMY;  Surgeon: Everitt Amber, MD;  Location: [facility];  Service: Gynecology;  Laterality: Bilateral;  . CERVICAL CONIZATION  W/BX N/A 03/23/2017   Procedure: CONIZATION CERVIX WITH BIOPSY;  Surgeon: Everett Graff, MD;  Location: [facility];  Service: Gynecology;  Laterality: N/A;  . CESAREAN SECTION  10/20/02  . CESAREAN SECTION N/A 11/29/2015   Procedure: CESAREAN SECTION;  Surgeon: Everett Graff, MD;  Location: [facility];  Service: Obstetrics;  Laterality: N/A;  RNFA requested - Heather K  . COLPOSCOPY  06/2009   "precancerous cells" on biopsy  . INTRAUTERINE DEVICE INSERTION  06/14/09   Mirena  . IUD REMOVAL N/A 03/23/2017   Procedure: INTRAUTERINE DEVICE (IUD) REMOVAL;  Surgeon: Everett Graff, MD;  Location: [facility];  Service: Gynecology;  Laterality: N/A;  . ROBOTIC ASSISTED TOTAL HYSTERECTOMY N/A 05/11/2017   Procedure: ROBOTIC ASSISTED TOTAL HYSTERECTOMY;  Surgeon: Everitt Amber, MD;  Location: [facility];  Service: Gynecology;  Laterality: N/A;  . SENTINEL NODE BIOPSY N/A 05/11/2017   Procedure: SENTINEL NODE BIOPSY;  Surgeon: Everitt Amber, MD;  Location: [facility];  Service: Gynecology;  Laterality: N/A;  . TONSILLECTOMY    . TONSILLECTOMY AND ADENOIDECTOMY  1992    OB History    Gravida  3   Para  2   Term  2   Preterm      AB  1   Living  2     SAB  1   TAB      Ectopic      Multiple  0   Live Births  2  Home Medications    Prior to Admission medications   Medication Sig Start Date End Date Taking? Authorizing Provider  albuterol (PROVENTIL HFA;VENTOLIN HFA) 108 (90 Base) MCG/ACT inhaler Inhale 1-2 puffs into the lungs every 6 (six) hours as needed for wheezing or shortness of breath.    [provider]  ciprofloxacin (CIPRO) 250 MG tablet Take 1 tablet (250 mg total) by mouth 2 (two) times daily. 05/27/17   Everitt Amber, MD  clotrimazole-betamethasone (LOTRISONE) cream Apply 1 application topically 2 (two) times daily as needed. For irritation. 04/07/17   [provider]  metroNIDAZOLE (FLAGYL) 500 MG tablet Take 1 tablet (500 mg total) by mouth 3 (three)  times daily. 05/27/17   Everitt Amber, MD  sertraline (ZOLOFT) 25 MG tablet Take 25 mg by mouth daily.    [provider]  simethicone (GAS-X) 80 MG chewable tablet Chew 1 tablet (80 mg total) by mouth every 6 (six) hours as needed for flatulence. 05/27/17   Everitt Amber, MD    Family History Family History  Problem Relation Age of Onset  . Stroke Father 76       blocked carotid artery, left side  . Emphysema Maternal Grandmother        smoker  . Stroke Maternal Grandfather 89  . Dementia Paternal Grandmother   . Cancer Paternal Grandfather        lung, smoker?    Social History Social History   Tobacco Use  . Smoking status: Current Every Day Smoker    Packs/day: 1.00    Years: 16.00    Pack years: 16.00    Types: Cigarettes  . Smokeless tobacco: Never Used  Substance Use Topics  . Alcohol use: No    Alcohol/week: 0.0 oz  . Drug use: No     Allergies   Patient has no known allergies.   Review of Systems Review of Systems  Neurological: Negative for headaches.  All other systems reviewed and are negative.    Physical Exam Triage Vital Signs ED Triage Vitals  Enc Vitals Group     BP 08/02/17 1007 119/76     Pulse Rate 08/02/17 1007 (!) 53     Resp --      Temp 08/02/17 1007 (!) 97.5 F (36.4 C)     Temp Source 08/02/17 1007 Oral     SpO2 08/02/17 1007 97 %     Weight 08/02/17 1008 141 lb (64 kg)     Height 08/02/17 1008 5\' 5"  (1.651 m)     Head Circumference --      [place] --      Pain Score 08/02/17 1007 5     Pain Loc --      Pain Edu? --      Excl. in [place]? --    No data found.  Updated Vital Signs BP 119/76 (BP Location: Right Arm)   Pulse (!) 53   Temp (!) 97.5 F (36.4 C) (Oral)   Ht 5\' 5"  (1.651 m)   Wt 141 lb (64 kg)   LMP 04/26/2017 Comment: hysterectomy  SpO2 97%   BMI 23.46 kg/m   Visual Acuity Right Eye Distance:   Left Eye Distance:   Bilateral Distance:    Right Eye Near:   Left Eye Near:    Bilateral Near:      Physical Exam  Constitutional: She appears well-developed and well-nourished.  HENT:  Head: Normocephalic and atraumatic.  Right Ear: Hearing normal. No swelling.  Mouth/Throat:  Mucous membranes are normal. Posterior oropharyngeal edema and posterior oropharyngeal erythema present.  Eyes: Pupils are equal, round, and reactive to light.  Neck: Normal range of motion.  Cardiovascular: Normal rate.  Pulmonary/Chest: Effort normal.  Abdominal: Soft.  Neurological: She is alert.  Skin: Skin is warm.  Psychiatric: She has a normal mood and affect.  Nursing note and vitals reviewed.    UC Treatments / Results  Labs (all labs ordered are listed, but only abnormal results are displayed) Labs Reviewed  STREP A DNA PROBE  POCT RAPID STREP A (OFFICE)    EKG None  Radiology No results found.  Procedures Procedures (including critical care time)  Medications Ordered in UC Medications - No data to display  Initial Impression / Assessment and Plan / UC Course  I have reviewed the triage vital signs and the nursing notes.  Pertinent labs & imaging results that were available during my care of the patient were reviewed by me and considered in my medical decision making (see chart for details).   MDM  Strep is negative pt counseled on viral illness   Final Clinical Impressions(s) / UC Diagnoses   Final diagnoses:  Sore throat  Viral pharyngitis     Discharge Instructions     Warm salt water gargles,     ED Prescriptions    None     Controlled Substance Prescriptions [place] Controlled Substance Registry consulted? n/a   Fransico Meadow, Hershal Coria 08/03/17 [id number]

## 2017-08-03 NOTE — Telephone Encounter (Signed)
Left VM with lab results and contact information if any questions.

## 2017-08-03 NOTE — Telephone Encounter (Signed)
Pt called and stated that her throat was not any better, and that she had white all in the back of her throat.  Spoke with Alyse Low, PA-C, and she said it is likely viral, and to continue the warm salt water gargles for a couple of days and see if she notices any relief.

## 2017-11-11 DIAGNOSIS — C539 Malignant neoplasm of cervix uteri, unspecified: Secondary | ICD-10-CM | POA: Diagnosis not present

## 2017-11-11 DIAGNOSIS — N760 Acute vaginitis: Secondary | ICD-10-CM | POA: Diagnosis not present

## 2017-11-11 DIAGNOSIS — Z113 Encounter for screening for infections with a predominantly sexual mode of transmission: Secondary | ICD-10-CM | POA: Diagnosis not present

## 2017-11-11 DIAGNOSIS — F17210 Nicotine dependence, cigarettes, uncomplicated: Secondary | ICD-10-CM | POA: Diagnosis not present

## 2017-11-21 ENCOUNTER — Telehealth: Payer: 59 | Admitting: Family

## 2017-11-21 DIAGNOSIS — J019 Acute sinusitis, unspecified: Secondary | ICD-10-CM

## 2017-11-21 MED ORDER — AMOXICILLIN-POT CLAVULANATE 875-125 MG PO TABS: 1.0000 | ORAL_TABLET | Freq: Two times a day (BID) | ORAL | 0 refills | Status: DC

## 2017-11-21 NOTE — Progress Notes (Signed)

## 2018-01-21 DIAGNOSIS — N760 Acute vaginitis: Secondary | ICD-10-CM | POA: Diagnosis not present

## 2018-01-21 DIAGNOSIS — A609 Anogenital herpesviral infection, unspecified: Secondary | ICD-10-CM | POA: Diagnosis not present

## 2018-01-21 DIAGNOSIS — N898 Other specified noninflammatory disorders of vagina: Secondary | ICD-10-CM | POA: Diagnosis not present

## 2018-01-21 DIAGNOSIS — Z139 Encounter for screening, unspecified: Secondary | ICD-10-CM | POA: Diagnosis not present

## 2018-01-21 DIAGNOSIS — B373 Candidiasis of vulva and vagina: Secondary | ICD-10-CM | POA: Diagnosis not present

## 2018-02-16 ENCOUNTER — Emergency Department (INDEPENDENT_AMBULATORY_CARE_PROVIDER_SITE_OTHER): Payer: 59

## 2018-02-16 ENCOUNTER — Other Ambulatory Visit: Payer: Self-pay

## 2018-02-16 ENCOUNTER — Emergency Department (INDEPENDENT_AMBULATORY_CARE_PROVIDER_SITE_OTHER)
Admission: EM | Admit: 2018-02-16 | Discharge: 2018-02-16 | Disposition: A | Discharge status: Home / Self Care | Payer: 59 | Source: Home / Self Care | Attending: Family Medicine | Admitting: Family Medicine

## 2018-02-16 ENCOUNTER — Encounter: Payer: Self-pay | Admitting: *Deleted

## 2018-02-16 DIAGNOSIS — R05 Cough: Secondary | ICD-10-CM

## 2018-02-16 DIAGNOSIS — R509 Fever, unspecified: Secondary | ICD-10-CM

## 2018-02-16 DIAGNOSIS — J189 Pneumonia, unspecified organism: Secondary | ICD-10-CM

## 2018-02-16 DIAGNOSIS — J181 Lobar pneumonia, unspecified organism: Secondary | ICD-10-CM

## 2018-02-16 MED ORDER — LEVOFLOXACIN 750 MG PO TABS: 750.0000 mg | ORAL_TABLET | Freq: Every day | ORAL | 0 refills | Status: AC

## 2018-02-16 NOTE — ED Provider Notes (Signed)
Sara Shannon CARE    CSN: [id number] Arrival date & time: 02/16/18  1243     History   Chief Complaint Chief Complaint  Patient presents with  . Cough    HPI Sara Shannon is a 37 y.o. female.   Two days ago patient developed typical cold-like symptoms including mild sore throat, sinus congestion, headache, fatigue, fever/chills, and cough. Yesterday she developed left posterior back and lateral sharp pleuritic pain.  She continues to smoke.  The history is provided by the patient.    Past Medical History:  Diagnosis Date  . Abnormal Pap smear of cervix 06/2009   pos HR HPV with colpo--"precancerous cells" found per pt. 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 Anxiety    prescribed zoloft not taking  . Bronchitis    hx of   . Cancer ([place])    abnormal pap smear  . Depression   . GERD (gastroesophageal reflux disease)    hx of   . Hypothyroidism    no meds back to normal per patient  . Thyroid disease    middle school, returned to normal after childbirth    Patient Active Problem List   Diagnosis Date Noted  . Pelvic inflammation in female 05/27/2017  . Cervical cancer ([place]) 05/11/2017  . Status post repeat low transverse cesarean section 11/29/2015  . Hx of cesarean section 08/28/2015  . History of miscarriage, currently pregnant 08/28/2015  . Anxiety disorder 08/28/2015  . Hypothyroidism 08/28/2015  . Vitamin D deficiency 08/28/2015  . Rubella non-immune status, antepartum 08/28/2015  . GERD (gastroesophageal reflux disease) 08/28/2015  . Abnormal Pap smear of cervix 06/04/2009    Past Surgical History:  Procedure Laterality Date  . ABDOMINAL HYSTERECTOMY    . BILATERAL SALPINGECTOMY Bilateral 05/11/2017   Procedure: BILATERAL SALPINGECTOMY;  Surgeon: Everitt Amber, MD;  Location: [facility];  Service: Gynecology;  Laterality: Bilateral;  . CERVICAL CONIZATION W/BX N/A 03/23/2017   Procedure: CONIZATION CERVIX WITH BIOPSY;   Surgeon: Everett Graff, MD;  Location: [facility];  Service: Gynecology;  Laterality: N/A;  . CESAREAN SECTION  10/20/02  . CESAREAN SECTION N/A 11/29/2015   Procedure: CESAREAN SECTION;  Surgeon: Everett Graff, MD;  Location: [facility];  Service: Obstetrics;  Laterality: N/A;  RNFA requested - Heather K  . COLPOSCOPY  06/2009   "precancerous cells" on biopsy  . INTRAUTERINE DEVICE INSERTION  06/14/09   Mirena  . IUD REMOVAL N/A 03/23/2017   Procedure: INTRAUTERINE DEVICE (IUD) REMOVAL;  Surgeon: Everett Graff, MD;  Location: [facility];  Service: Gynecology;  Laterality: N/A;  . ROBOTIC ASSISTED TOTAL HYSTERECTOMY N/A 05/11/2017   Procedure: ROBOTIC ASSISTED TOTAL HYSTERECTOMY;  Surgeon: Everitt Amber, MD;  Location: [facility];  Service: Gynecology;  Laterality: N/A;  . SENTINEL NODE BIOPSY N/A 05/11/2017   Procedure: SENTINEL NODE BIOPSY;  Surgeon: Everitt Amber, MD;  Location: [facility];  Service: Gynecology;  Laterality: N/A;  . TONSILLECTOMY    . TONSILLECTOMY AND ADENOIDECTOMY  1992    OB History    Gravida  3   Para  2   Term  2   Preterm      AB  1   Living  2     SAB  1   TAB      Ectopic      Multiple  0   Live Births  2            Home Medications  Prior to Admission medications   Medication Sig Start Date End Date Taking? Authorizing Provider  levofloxacin (LEVAQUIN) 750 MG tablet Take 1 tablet (750 mg total) by mouth daily for 7 days. 02/16/18 02/23/18  Kandra Nicolas, MD    Family History Family History  Problem Relation Age of Onset  . Stroke Father 51       blocked carotid artery, left side  . Emphysema Maternal Grandmother        smoker  . Stroke Maternal Grandfather 26  . Dementia Paternal Grandmother   . Cancer Paternal Grandfather        lung, smoker?  . Atrial fibrillation Mother     Social History Social History   Tobacco Use  . Smoking status: Current Every Day Smoker    Packs/day: 0.50    Years: 16.00    Pack years: 8.00     Types: Cigarettes  . Smokeless tobacco: Never Used  Substance Use Topics  . Alcohol use: No    Alcohol/week: 0.0 standard drinks  . Drug use: No     Allergies   Patient has no known allergies.   Review of Systems Review of Systems + sore throat + cough + left pleuritic pain No wheezing + nasal congestion + post-nasal drainage No sinus pain/pressure No itchy/red eyes ? earache No hemoptysis No SOB + fever, + chills No nausea No vomiting No abdominal pain No diarrhea No urinary symptoms No skin rash + fatigue + myalgias + headache Used OTC meds without relief   Physical Exam Triage Vital Signs ED Triage Vitals [02/16/18 1314]  Enc Vitals Group     BP 111/68     Pulse Rate 65     Resp 16     Temp 98 F (36.7 C)     Temp Source Oral     SpO2 99 %     Weight 140 lb (63.5 kg)     Height 5\' 5"  (1.651 m)     Head Circumference      Peak Flow      Pain Score 3     Pain Loc      Pain Edu?      Excl. in [place]?    No data found.  Updated Vital Signs BP 111/68 (BP Location: Right Arm)   Pulse 65   Temp 98 F (36.7 C) (Oral)   Resp 16   Ht 5\' 5"  (1.651 m)   Wt 63.5 kg   LMP 04/26/2017 Comment: hysterectomy  SpO2 99%   BMI 23.30 kg/m   Visual Acuity Right Eye Distance:   Left Eye Distance:   Bilateral Distance:    Right Eye Near:   Left Eye Near:    Bilateral Near:     Physical Exam Nursing notes and Vital Signs reviewed. Appearance:  Patient appears stated age, and in no acute distress Eyes:  Pupils are equal, round, and reactive to light and accomodation.  Extraocular movement is intact.  Conjunctivae are not inflamed  Ears:  Canals normal.  Tympanic membranes normal.  Nose:  Mildly congested turbinates.  No sinus tenderness.   Pharynx:  Normal Neck:  Supple.  Enlarged posterior/lateral nodes are palpated bilaterally, tender to palpation on the left.   Lungs:  Bilateral rhonchi.  Breath sounds are equal.  Moving air well. Heart:  Regular rate  and rhythm without murmurs, rubs, or gallops.  Abdomen:  Nontender without masses or hepatosplenomegaly.  Bowel sounds are present.  No CVA or flank tenderness.  Extremities:  No edema.  Skin:  No rash present.    UC Treatments / Results  Labs (all labs ordered are listed, but only abnormal results are displayed) Labs Reviewed - No data to display  EKG None  Radiology Dg Chest 2 View  Result Date: 02/16/2018 CLINICAL DATA:  Cough and fever EXAM: CHEST - 2 VIEW COMPARISON:  May 03, 2017 FINDINGS: There is airspace consolidation in a portion of the anterior segment of the left upper lobe. A second focus of infiltrate is noted in the superior lingula. The right lung is clear except for slight scarring in the right upper lobe. Heart size and pulmonary vascularity are normal. No adenopathy. No bone lesions. IMPRESSION: Airspace consolidation in portions of the anterior segment left upper lobe and superior lingula consistent with pneumonia. Lungs elsewhere clear except for slight scarring in the right upper lobe. No adenopathy evident. These results will be called to the ordering clinician or representative by the Radiologist Assistant, and communication documented in the PACS or zVision Dashboard. Electronically Signed   By: Lowella Grip III M.D.   On: 02/16/2018 13:49    Procedures Procedures (including critical care time)  Medications Ordered in UC Medications - No data to display  Initial Impression / Assessment and Plan / UC Course  I have reviewed the triage vital signs and the nursing notes.  Pertinent labs & imaging results that were available during my care of the patient were reviewed by me and considered in my medical decision making (see chart for details).    Begin Levaquin. Return in about two weeks for repeat chest X-ray. If symptoms become significantly worse during the night or over the weekend, proceed to the local emergency room.    Final Clinical  Impressions(s) / UC Diagnoses   Final diagnoses:  Pneumonia of left upper lobe due to infectious organism ([place])  Lingular pneumonia     Discharge Instructions     Take plain guaifenesin (1200mg  extended release tabs such as Mucinex) twice daily, with plenty of water, for cough and congestion.  May add Pseudoephedrine (30mg , one or two every 4 to 6 hours) for sinus congestion.  Get adequate rest.   May use Afrin nasal spray (or generic oxymetazoline) each morning for about 5 days and then discontinue.  Also recommend using saline nasal spray several times daily and saline nasal irrigation (AYR is a common brand).   Try warm salt water gargles for sore throat.  Stop all antihistamines for now, and other non-prescription cough/cold preparations. May take Ibuprofen 200mg , 4 tabs every 8 hours with food for fever, body aches, chest pain, etc. May take Delsym Cough Suppressant at bedtime for nighttime cough.     ED Prescriptions    Medication Sig Dispense Auth. Provider   levofloxacin (LEVAQUIN) 750 MG tablet Take 1 tablet (750 mg total) by mouth daily for 7 days. 7 tablet Kandra Nicolas, MD        Kandra Nicolas, MD 02/19/18 2025

## 2018-02-16 NOTE — ED Triage Notes (Signed)
Pt c/o cough, fever and body aches x 2 days. Denies nasal congestion. Last dose tylenol 1100 today.

## 2018-02-16 NOTE — Discharge Instructions (Addendum)
Take plain guaifenesin (1200mg  extended release tabs such as Mucinex) twice daily, with plenty of water, for cough and congestion.  May add Pseudoephedrine (30mg , one or two every 4 to 6 hours) for sinus congestion.  Get adequate rest.   May use Afrin nasal spray (or generic oxymetazoline) each morning for about 5 days and then discontinue.  Also recommend using saline nasal spray several times daily and saline nasal irrigation (AYR is a common brand).   Try warm salt water gargles for sore throat.  Stop all antihistamines for now, and other non-prescription cough/cold preparations. May take Ibuprofen 200mg , 4 tabs every 8 hours with food for fever, body aches, chest pain, etc. May take Delsym Cough Suppressant at bedtime for nighttime cough.

## 2018-03-02 ENCOUNTER — Emergency Department
Admission: EM | Admit: 2018-03-02 | Discharge: 2018-03-02 | Disposition: A | Discharge status: Home / Self Care | Payer: 59 | Source: Home / Self Care

## 2018-03-02 ENCOUNTER — Emergency Department (INDEPENDENT_AMBULATORY_CARE_PROVIDER_SITE_OTHER): Payer: 59

## 2018-03-02 ENCOUNTER — Telehealth: Payer: Self-pay | Admitting: *Deleted

## 2018-03-02 ENCOUNTER — Other Ambulatory Visit: Payer: Self-pay

## 2018-03-02 DIAGNOSIS — J189 Pneumonia, unspecified organism: Secondary | ICD-10-CM

## 2018-03-02 NOTE — Telephone Encounter (Signed)
Spoke to pt given CXR results per Dr Assunta Found. Advised to work on smoking cessation. Pt agrees.

## 2018-03-02 NOTE — ED Triage Notes (Signed)
Sara Shannon is here today for a repeat chest xray for pneumonia on 02/16/2018. She reports that she is feeling better, but still has a cough.

## 2018-03-14 ENCOUNTER — Telehealth: Payer: Self-pay | Admitting: Osteopathic Medicine

## 2018-03-14 ENCOUNTER — Ambulatory Visit: Payer: 59 | Admitting: Osteopathic Medicine

## 2018-03-14 NOTE — Telephone Encounter (Signed)
No-show to establish care with Dr. Sheppard Coil, 03/14/18. If late or no-show again, will not accept patient to this clinic. Please call her to reschedule visit and inform them of this policy.

## 2018-03-22 NOTE — Telephone Encounter (Signed)
I called pt and she currently has some mental health issues she is dealing with and apologizes. I informed her of our No Show Policy

## 2018-03-22 NOTE — Telephone Encounter (Signed)
That's ok, I'd love to help, but she's gotta come see me!  Thanks for reaching out to her

## 2018-03-23 DIAGNOSIS — F332 Major depressive disorder, recurrent severe without psychotic features: Secondary | ICD-10-CM | POA: Diagnosis not present

## 2018-04-04 ENCOUNTER — Telehealth: Payer: Self-pay

## 2018-04-04 NOTE — Telephone Encounter (Signed)
Pt called this am asking for someone to call her back.Marland KitchenMarland KitchenAttempted, mailbox is full.

## 2018-04-07 DIAGNOSIS — F4310 Post-traumatic stress disorder, unspecified: Secondary | ICD-10-CM | POA: Diagnosis not present

## 2018-05-23 DIAGNOSIS — N760 Acute vaginitis: Secondary | ICD-10-CM | POA: Diagnosis not present

## 2018-05-23 DIAGNOSIS — B009 Herpesviral infection, unspecified: Secondary | ICD-10-CM | POA: Diagnosis not present

## 2018-05-23 DIAGNOSIS — R634 Abnormal weight loss: Secondary | ICD-10-CM | POA: Diagnosis not present

## 2018-05-23 DIAGNOSIS — N898 Other specified noninflammatory disorders of vagina: Secondary | ICD-10-CM | POA: Diagnosis not present

## 2018-05-23 DIAGNOSIS — C539 Malignant neoplasm of cervix uteri, unspecified: Secondary | ICD-10-CM | POA: Diagnosis not present

## 2018-05-23 DIAGNOSIS — E559 Vitamin D deficiency, unspecified: Secondary | ICD-10-CM | POA: Diagnosis not present

## 2018-05-23 DIAGNOSIS — R112 Nausea with vomiting, unspecified: Secondary | ICD-10-CM | POA: Diagnosis not present

## 2018-05-23 DIAGNOSIS — R61 Generalized hyperhidrosis: Secondary | ICD-10-CM | POA: Diagnosis not present

## 2018-05-31 DIAGNOSIS — R140 Abdominal distension (gaseous): Secondary | ICD-10-CM | POA: Diagnosis not present

## 2018-05-31 DIAGNOSIS — K5904 Chronic idiopathic constipation: Secondary | ICD-10-CM | POA: Diagnosis not present

## 2018-05-31 DIAGNOSIS — R6881 Early satiety: Secondary | ICD-10-CM | POA: Diagnosis not present

## 2018-06-01 DIAGNOSIS — Z01419 Encounter for gynecological examination (general) (routine) without abnormal findings: Secondary | ICD-10-CM | POA: Diagnosis not present

## 2018-06-01 DIAGNOSIS — Z1231 Encounter for screening mammogram for malignant neoplasm of breast: Secondary | ICD-10-CM | POA: Diagnosis not present

## 2018-06-02 ENCOUNTER — Other Ambulatory Visit: Payer: Self-pay | Admitting: Obstetrics and Gynecology

## 2018-06-02 DIAGNOSIS — Z139 Encounter for screening, unspecified: Secondary | ICD-10-CM | POA: Diagnosis not present

## 2018-06-02 DIAGNOSIS — N838 Other noninflammatory disorders of ovary, fallopian tube and broad ligament: Secondary | ICD-10-CM

## 2018-06-02 DIAGNOSIS — R1909 Other intra-abdominal and pelvic swelling, mass and lump: Secondary | ICD-10-CM | POA: Diagnosis not present

## 2018-06-02 DIAGNOSIS — R102 Pelvic and perineal pain: Secondary | ICD-10-CM | POA: Diagnosis not present

## 2018-06-03 ENCOUNTER — Ambulatory Visit
Admission: RE | Admit: 2018-06-03 | Discharge: 2018-06-03 | Disposition: A | Discharge status: Home / Self Care | Payer: 59 | Source: Ambulatory Visit | Attending: Obstetrics and Gynecology | Admitting: Obstetrics and Gynecology

## 2018-06-03 ENCOUNTER — Telehealth: Payer: Self-pay

## 2018-06-03 DIAGNOSIS — N839 Noninflammatory disorder of ovary, fallopian tube and broad ligament, unspecified: Secondary | ICD-10-CM | POA: Diagnosis not present

## 2018-06-03 DIAGNOSIS — R634 Abnormal weight loss: Secondary | ICD-10-CM | POA: Diagnosis not present

## 2018-06-03 DIAGNOSIS — N838 Other noninflammatory disorders of ovary, fallopian tube and broad ligament: Secondary | ICD-10-CM

## 2018-06-03 MED ORDER — IOPAMIDOL (ISOVUE-300) INJECTION 61%
100.0000 mL | Freq: Once | INTRAVENOUS | Status: AC | PRN
  Administered 2018-06-03: 100 mL via INTRAVENOUS

## 2018-06-03 NOTE — Telephone Encounter (Addendum)
Returned pt's call, she verbalized that she has been having stomach issues over last 6-7 months, nausea, weight loss, feeling bloated, decreased appetite, fullness so may only eat one meal a day, and night sweats- having to change her shirt at night. Reports she had a recent ultrasound and CA-125 and CT done today.  Pt said Dr Mancel Bale told her that she has a left 3 cm complex mass at left ovary and simple cyst at right ovary and CA 125 was 6. Reports intercourse is painful toward right ovary side.   Pt verbalized that she wanted to see what Dr Denman George thought.   Notified Joylene John NP, per her , Dr Denman George will want to review Dr. Mancel Bale office u/s and the CT and we can see if can retrieve on Monday.  I told her once Dr Denman George reviews then we can call her with recommendations for further testing or office visit here.  Pt voiced understanding. No other needs per pt at this time.

## 2018-06-06 ENCOUNTER — Telehealth: Payer: Self-pay

## 2018-06-06 ENCOUNTER — Other Ambulatory Visit: Payer: Self-pay | Admitting: Obstetrics and Gynecology

## 2018-06-06 DIAGNOSIS — R1909 Other intra-abdominal and pelvic swelling, mass and lump: Secondary | ICD-10-CM

## 2018-06-06 DIAGNOSIS — R102 Pelvic and perineal pain: Secondary | ICD-10-CM

## 2018-06-06 NOTE — Telephone Encounter (Signed)
See my previous note as pt would like Dr Denman George to review her recent ultrasound results from Dr Mancel Bale office.    Outgoing call to Dr Robert's office to request most recent u/s and CA 125.  Her medical assistant said that Dr Mancel Bale was not in the office but would be back on Wednesday and they had not heard from pt that she would like records sent.  I told her that this was a mutual patient as Dr Mancel Bale referred pt to Dr Alycia Rossetti according to notes from  early Jan 2019.    I was unable to receive those records at this time- outgoing call to patient and let her know that she may need to request recent records be sent here from Dr Mancel Bale' office and/or sign a release there. Pt voiced understanding and she will contact their office.

## 2018-06-07 ENCOUNTER — Other Ambulatory Visit: Payer: Self-pay | Admitting: Gynecologic Oncology

## 2018-06-07 ENCOUNTER — Telehealth: Payer: Self-pay

## 2018-06-07 DIAGNOSIS — N83202 Unspecified ovarian cyst, left side: Secondary | ICD-10-CM

## 2018-06-07 NOTE — Telephone Encounter (Signed)
Told Sara Shannon that Dr. Denman George recommends a repeat US in 6 weeks from the [place] on 06-02-18 at [facility] and then see Dr. Denman George afterwards. Set appointment up for 07-20-18 at [facility] for [place] at 1100.  Arrive at 1045 with a full bladder.  Appointment with Dr. Denman George is the same day at 1330. Arrive at 1315.  Sara Bok verbalized understanding.

## 2018-06-07 NOTE — Progress Notes (Signed)
Plan for repeat US from 06/02/2018 at GYN office to evaluate left ovarian cyst prior to appt with Dr. Denman George.

## 2018-06-30 ENCOUNTER — Telehealth: Payer: Self-pay | Admitting: *Deleted

## 2018-06-30 NOTE — Telephone Encounter (Signed)
Called and spoke with the patient regarding her appt on 4/15. Explained that we are canceling her appt with Dr. Denman George and we will call her with the [place] scan. Explained to the patient that there may be a potential for the scan to be moved out due to the COVID-19 virus, patient verbalized understanding.

## 2018-07-18 ENCOUNTER — Telehealth: Payer: Self-pay | Admitting: *Deleted

## 2018-07-18 NOTE — Telephone Encounter (Signed)
Patient returned call and is ok with the scan being moved. Patient stated "She, Dr. Denman George does know that I'm not comfortable waiting a long time to get this mass and the ovaries out. I waited to long with the other surgery and cancer. I will pushing to them removed. I am also having bad night sweats." Explained to the the patient that all surgery cases are on hold, but if Dr. Denman George or Lenna Sciara needed to talk her they would call her.

## 2018-07-18 NOTE — Telephone Encounter (Signed)
Called and left the patient a message to call the office back. Need to let the patient know that [facility] is halting all scans that are not urgent or life threatening until June 1st. We will need to schedule the scan around her ovulation schedule

## 2018-07-20 ENCOUNTER — Ambulatory Visit (HOSPITAL_COMMUNITY): Payer: Medicaid Other

## 2018-07-20 ENCOUNTER — Ambulatory Visit: Payer: 59 | Admitting: Gynecologic Oncology

## 2018-07-21 IMAGING — CR DG ABDOMEN 2V
3 series · 3 of 3 positions shown · non-contrast
Comparison: None.

CLINICAL DATA: Recent hysterectomy for cervical carcinoma, now with
lower abdominal pain, nausea and constipation

EXAM:
ABDOMEN - 2 VIEW

[w abdomen upright]
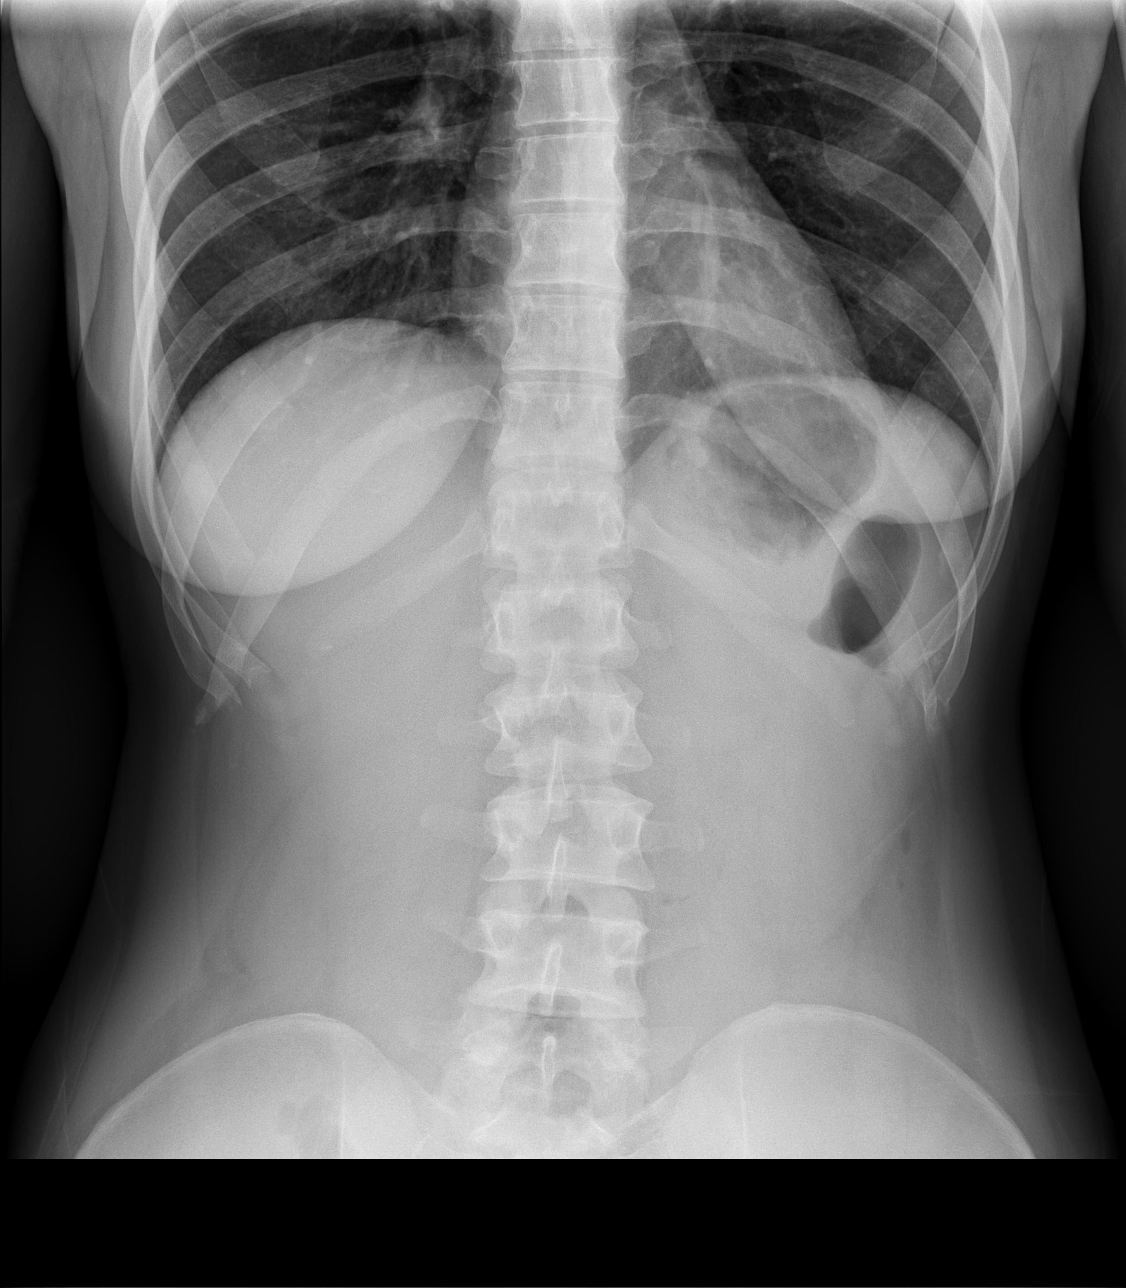

[t abdomen supine (1 of 2)]
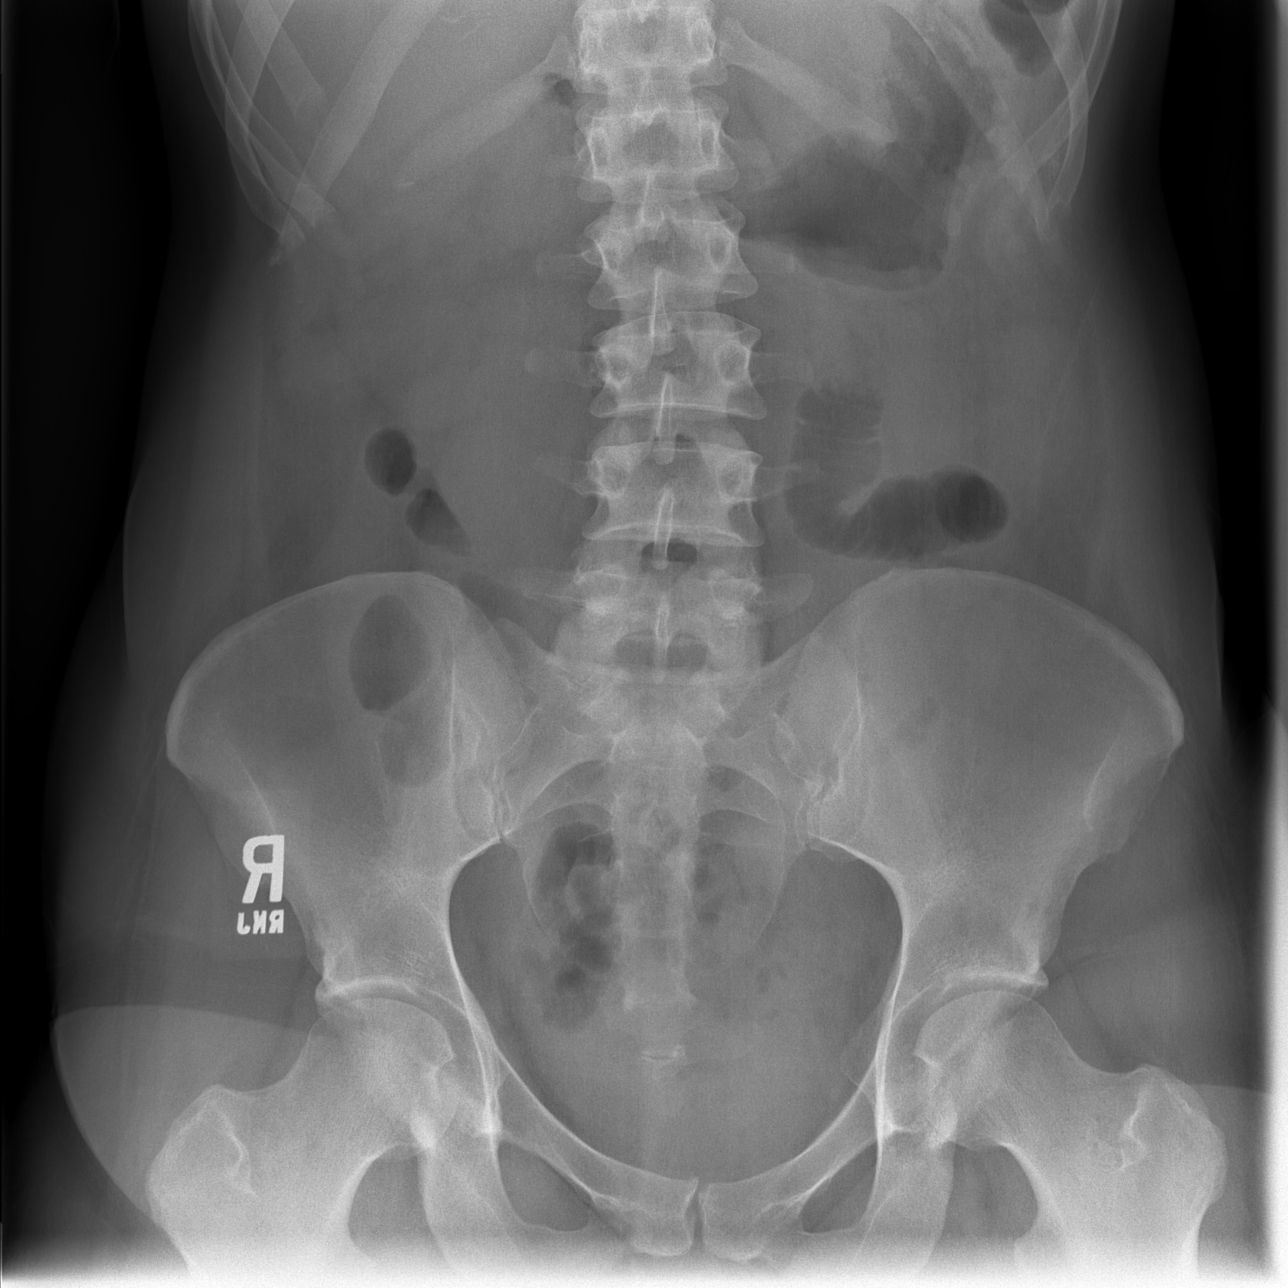

[t abdomen supine (2 of 2)]
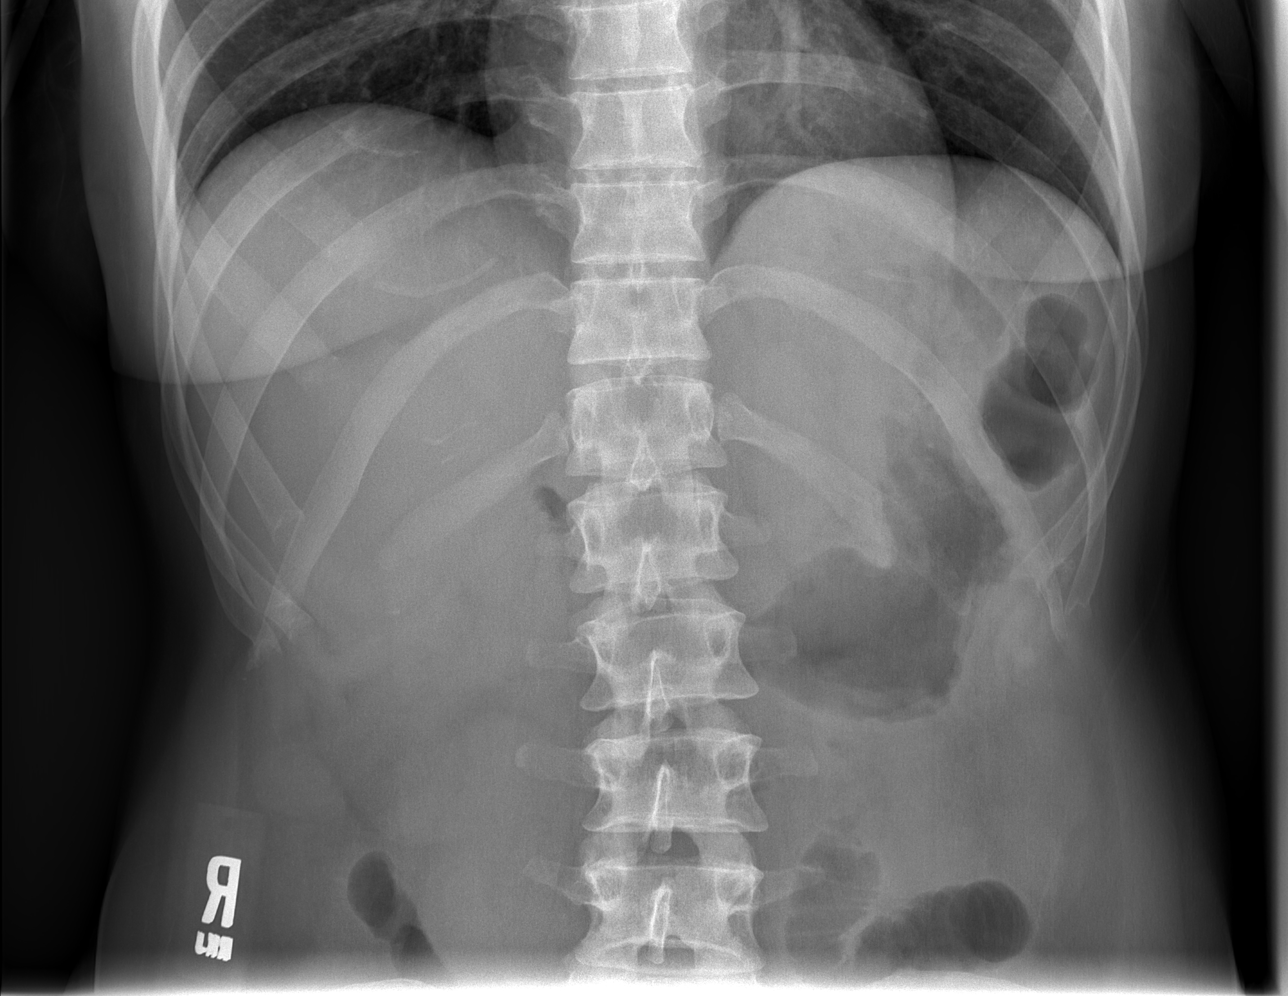

[3 of 3 positions shown; findings below may reference images not displayed]

FINDINGS: Supine and erect views of the abdomen show no bowel obstruction. No
free air is seen on the erect view. No opaque calculi are noted. The
bones are unremarkable.
IMPRESSION: No bowel obstruction.  No free air.  No opaque calculi.

## 2018-07-21 IMAGING — CT CT ABD-PELV W/ CM
2 of 5 series · 15 of 46 positions shown, 17 images · IV contrast (APPLIED)
Comparison: None.

CLINICAL DATA: Cervical carcinoma. Two weeks postop from
hysterectomy. Worsening right lower quadrant pain, nausea, and
chills for 1 week.

EXAM:
CT ABDOMEN AND PELVIS WITH CONTRAST
TECHNIQUE: Multidetector CT imaging of the abdomen and pelvis was performed
using the standard protocol following bolus administration of
intravenous contrast.
CONTRAST:  100mL H9ERF8-CEE IOPAMIDOL (H9ERF8-CEE) INJECTION 61%

[Series 2: axial st · axial · 0.68mm/px · z∈[+1088,+1482]mm · 12 of 95 slices shown, 14 images]
[im 8/95  soft-tissue]
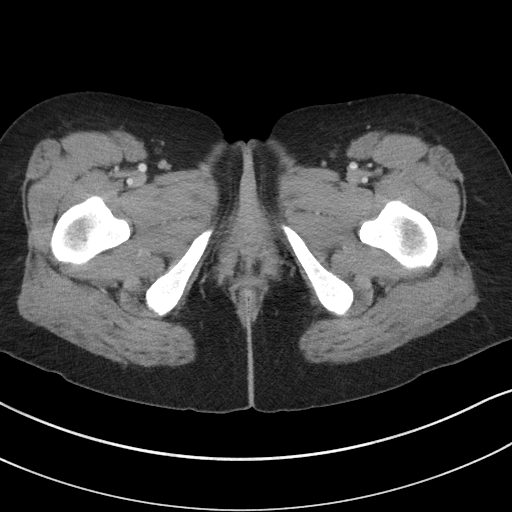
[im 8/95  bone]
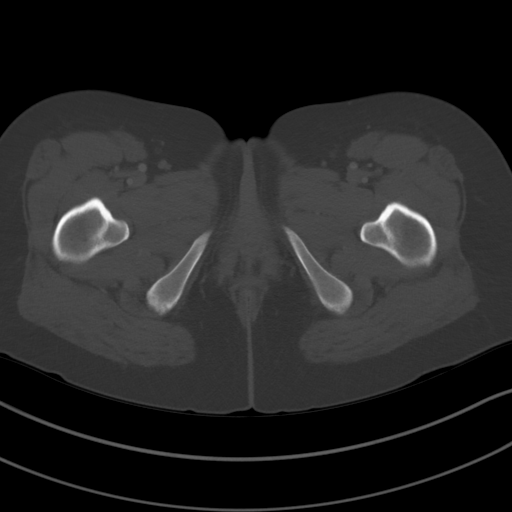
[im 15/95  soft-tissue]
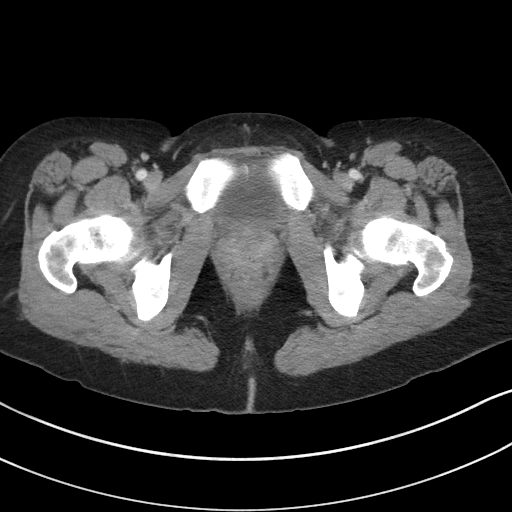
[im 22/95  soft-tissue]
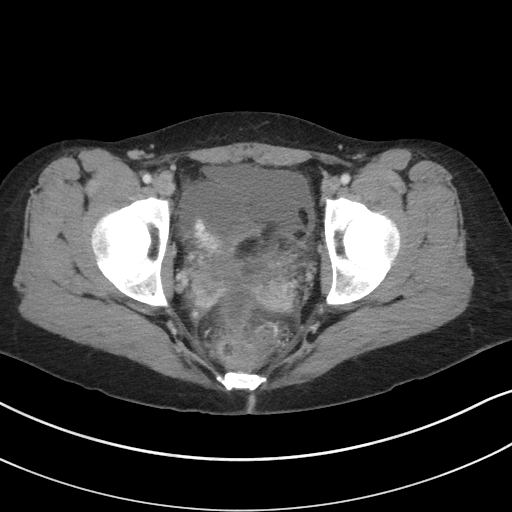
[im 29/95  soft-tissue]
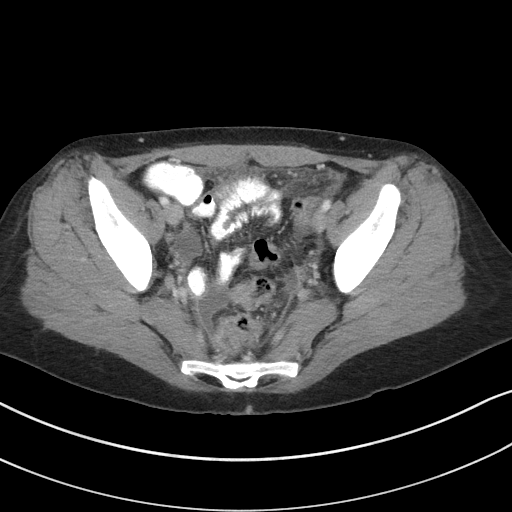
[im 37/95  soft-tissue]
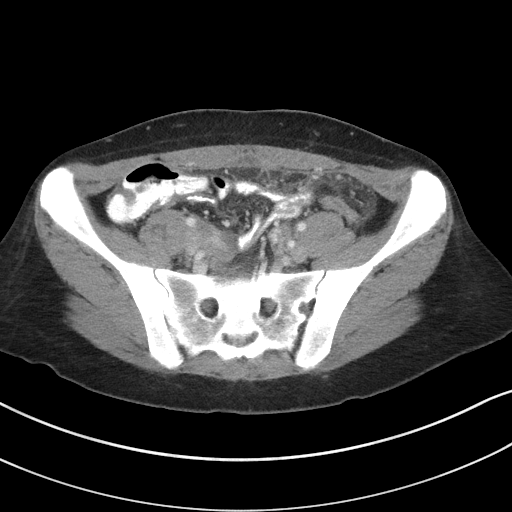
[im 44/95  soft-tissue]
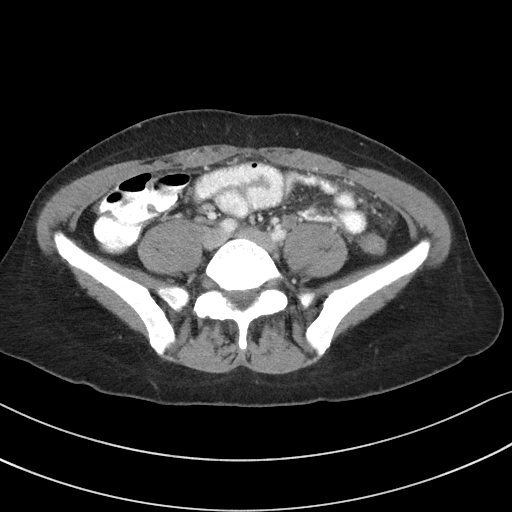
[im 51/95  soft-tissue]
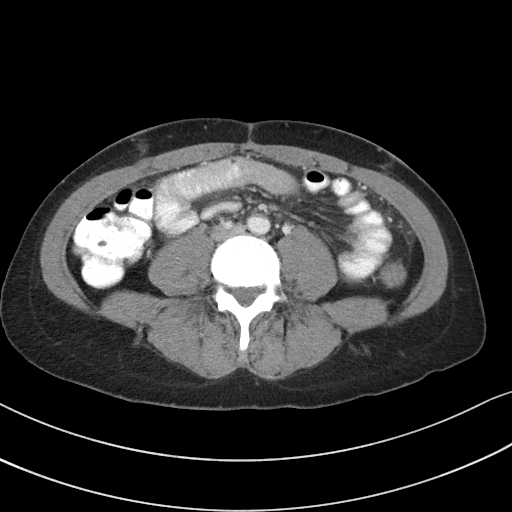
[im 58/95  soft-tissue]
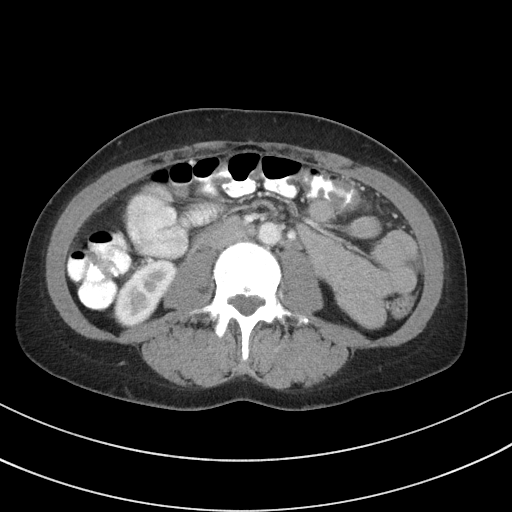
[im 66/95  soft-tissue]
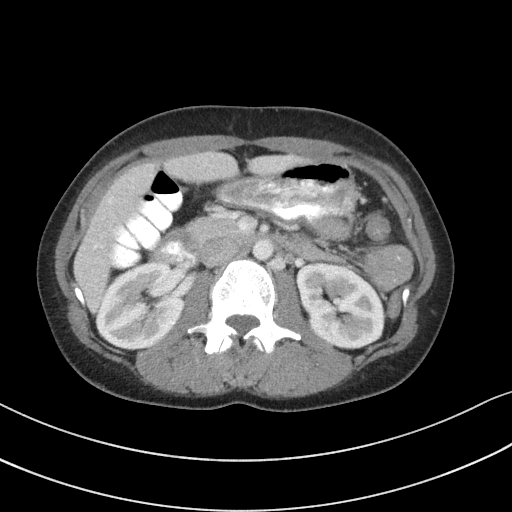
[im 66/95  bone]
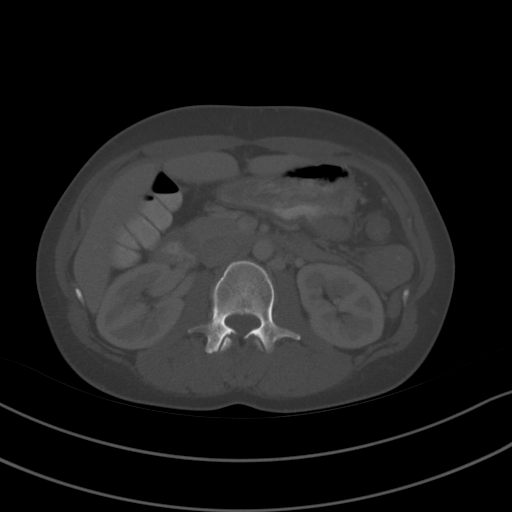
[im 73/95  soft-tissue]
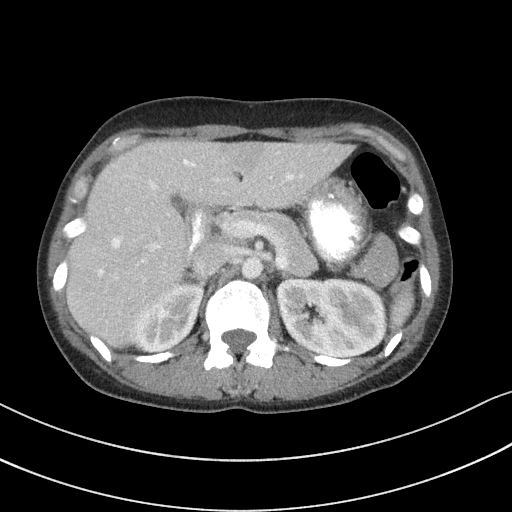
[im 80/95  soft-tissue]
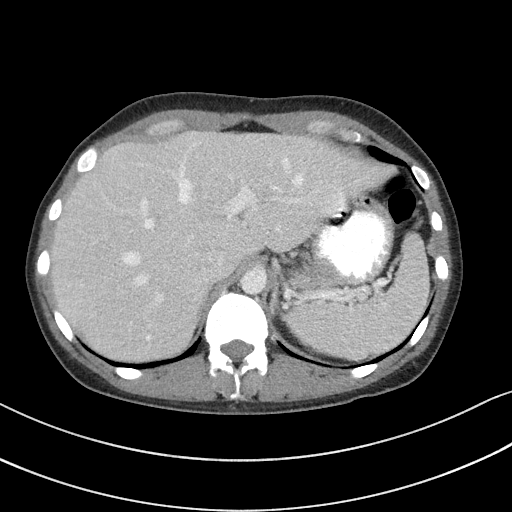
[im 87/95  soft-tissue]
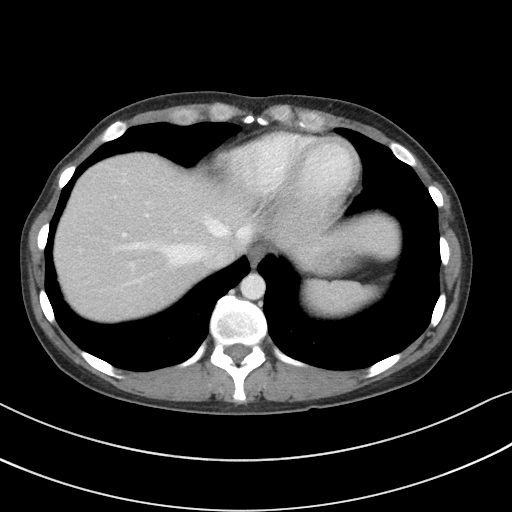

[Series 5: coronal st · coronal · 0.68mm/px · 3 of 76 slices shown]
[im 26/76  soft-tissue]
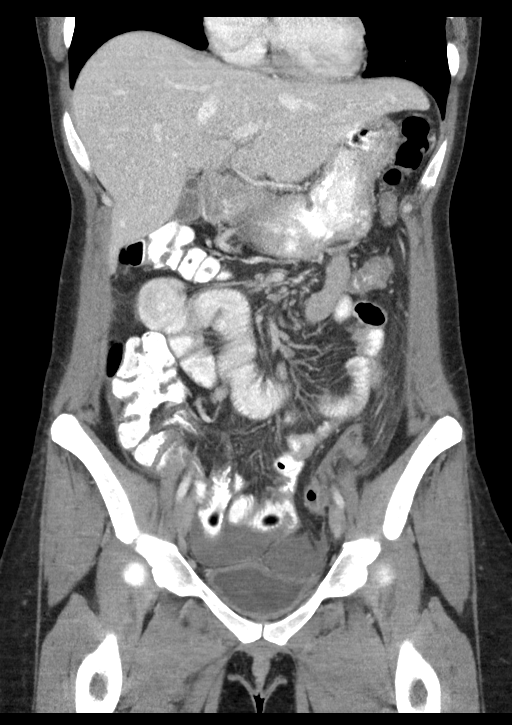
[im 34/76  soft-tissue]
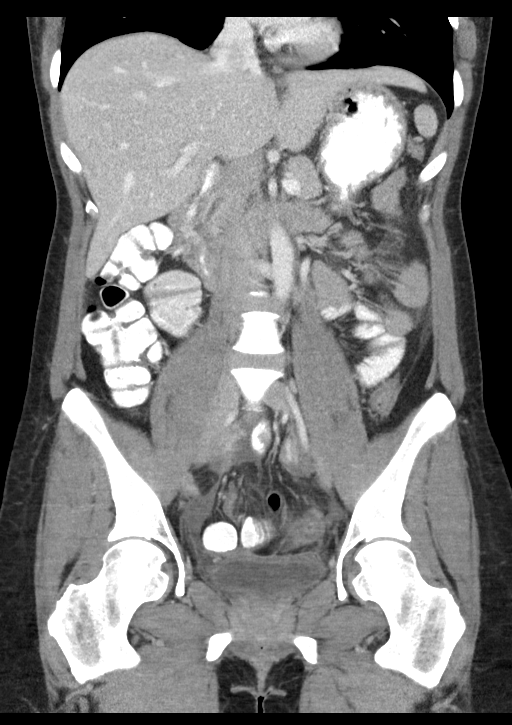
[im 42/76  soft-tissue]
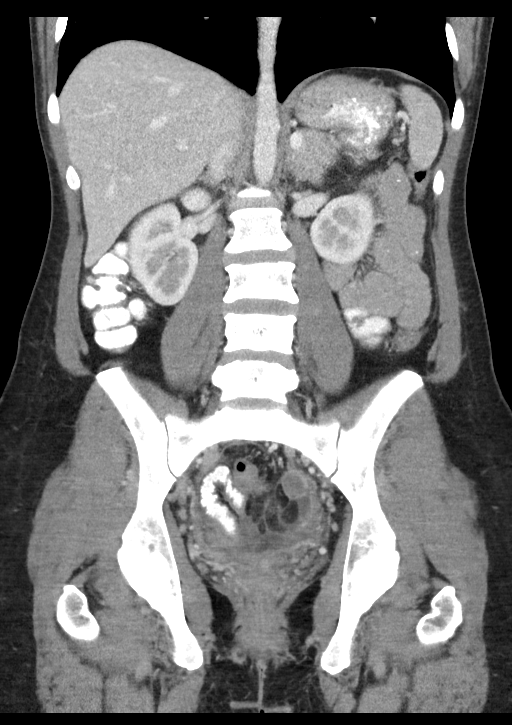

[15 of 46 positions shown; findings below may reference images not displayed]

FINDINGS: Lower Chest: 4 mm pulmonary nodule in lateral right lower lobe on
image [DATE]. 5 mm pulmonary nodule in lateral left lower lobe on image
[DATE].

Hepatobiliary: No hepatic masses identified. Focal fatty
infiltration seen adjacent to falciform ligament. Gallbladder is
unremarkable.

Pancreas:  No mass or inflammatory changes.

Spleen: Within normal limits in size and appearance.

Adrenals/Urinary Tract: No masses identified. No evidence of
hydronephrosis. No evidence of ureteral or bladder injury.

Stomach/Bowel: No evidence of obstruction, inflammatory process or
abnormal fluid collections.

Vascular/Lymphatic: No pathologically enlarged lymph nodes. No
abdominal aortic aneurysm.

Reproductive: Postop changes from recent hysterectomy. No adnexal
mass identified. Small amount of free pelvic fluid is seen.
Enhancement of peritoneal surfaces is noted in the dependent portion
of the pelvis.

Other:  None.

Musculoskeletal:  No suspicious bone lesions identified.
IMPRESSION: Status post hysterectomy. Small amount of free pelvic fluid and
peritoneal enhancement. No evidence of abscess.

No evidence of lower urinary tract injury or hydronephrosis.

Indeterminate bilateral lower lobe pulmonary nodules, largest on the
left measuring 5 mm. Recommend continued followup by chest CT
without contrast in 6 months.

## 2018-09-07 ENCOUNTER — Telehealth: Payer: Self-pay | Admitting: *Deleted

## 2018-09-07 ENCOUNTER — Ambulatory Visit (HOSPITAL_COMMUNITY)
Admission: RE | Admit: 2018-09-07 | Discharge: 2018-09-07 | Disposition: A | Discharge status: Home / Self Care | Payer: Medicaid Other | Source: Ambulatory Visit | Attending: Gynecologic Oncology | Admitting: Gynecologic Oncology

## 2018-09-07 ENCOUNTER — Other Ambulatory Visit: Payer: Self-pay

## 2018-09-07 DIAGNOSIS — N83202 Unspecified ovarian cyst, left side: Secondary | ICD-10-CM | POA: Diagnosis not present

## 2018-09-07 NOTE — Telephone Encounter (Signed)
error 

## 2018-09-08 ENCOUNTER — Other Ambulatory Visit: Payer: Self-pay | Admitting: Obstetrics and Gynecology

## 2018-09-08 ENCOUNTER — Telehealth: Payer: Self-pay | Admitting: Gynecologic Oncology

## 2018-09-08 NOTE — Telephone Encounter (Signed)
Patient called wanting [place] results.  Advised her that Dr. Denman George had reviewed the results and states that the cyst is smaller and she recommends observing it.  She states she is not comfortable with that.  She wants the ovary removed.  She states she discussed with Dr. Mancel Bale who said she could remove the left ovary but she wants to see what Dr. Denman George thinks and when/if she could do it sooner.  She states she is not working but wants it done now.  Advised her that I would discuss with Dr. Denman George in the am and call her to discuss. No other concerns voiced.  She states ovarian cancer scares her and she feels she has other symptoms of it.

## 2018-09-09 ENCOUNTER — Telehealth: Payer: Self-pay | Admitting: Gynecologic Oncology

## 2018-09-09 NOTE — Telephone Encounter (Signed)
Called patient to follow up on discussion around ovarian cyst. She states Dr. Mancel Bale is going to take her to the OR next week to remove the ovary with the cyst.  All questions answered. Advised her to call for any needs.

## 2018-09-10 NOTE — H&P (Addendum)
Sara Shannon is a 38 y.o. female P: 2-0-1-2 who is S/P Hysterectomy due to cervical cancer in 2019 and is now presenting for removal of her left ovary due to a persistent left ovary mass.  Over the past year the patient lost a considerable amount of weight and was advised by her  gastroenterologist to have a GYN evaluation.  A pelvic ultrasound in February 2020 revealed: a surgically absent uterus/cervix,  right ovary--simple cyst-2.4 cm ; left ovary-complex mass measuring 3.1 cm with increased blood flow around periphery of mass,  but not seen internally.  The patient denies any pelvic pain, changes bowel or bladder function or  vaginitis symptoms but does admit to some right sided dyspareunia.  A follow up ultrasound in June 2020 revealed  a resolved right ovarian cyst and the previously complex left mass decreased in size to 2.1 cm-mostly cystic with few internal echoes.  Given the persistence of her left mass the patient has opted to have her left ovary removed.   Past Medical History  OB History: G: 3; P: 2-0-1-2'; C-section: 2004 and 2012  GYN History: menarche: 38 YO;    Contraception:Hysterectomy; History of HPV and HSV-2;   Last PAP smear: 2020-normal with negative HPV  Medical History: Vitamin D Deficiency, Cervical Cancer (stage 1a), Hypothyroidism in Pregnancy, Pneumonia and GERD  Surgical History: 2019 Robot Assisted Total Laparoscopic Hysterectomy/Bilateral Salpingectomy;  2018 Cold Knife Cervical Conization and 1991  Tonsillectomy and Adenoidectomy Denies problems with anesthesia or history of blood transfusions  Family History: Stroke, Dementia, Lung Cancer, Atrial Fibrillation and Emphysema  Social History: Divorced and employed as a Therapist, music;   Uses alcohol rarely and smokes 1 pack of cigarettes daily.  Medicines: Sertraline 25 mg daily Ibuprofen 800 mg every 8 hours prn   No Known Allergies   Denies sensitivity to peanuts, shellfish, soy, latex or  adhesives.   ROS: Admits to occasional constipation and loose stools (has not been diagnosed with IBS)  but denies headache, vision changes, nasal congestion, dysphagia, tinnitus, dizziness, hoarseness, cough,  chest pain, shortness of breath, nausea, vomiting, diarrhea,constipation,  urinary frequency, urgency  dysuria, hematuria, vaginitis symptoms, pelvic pain, swelling of joints,easy bruising,  myalgias, arthralgias, skin rashes, unexplained weight loss and except as is mentioned in the history of present illness, patient's review of systems is otherwise negative.     Physical Exam  Bp: 110/62  P: 74 bpm  R: 16  Temperature: 97 degrees F orally Weight: 138 lbs.  Height: 5'5"   BMI: 23  Neck: supple without masses or thyromegaly Lungs: clear to auscultation Heart: regular rate and rhythm Abdomen: soft, non-tender and no organomegaly Pelvic:EGBUS- wnl; vagina-normal rugae; uterus/ cervix-surgically absent;  adnexae-no tenderness or masses Extremities:  no clubbing, cyanosis or edema   Assesment:  Persistent Left Ovarian Cyst                       Dyspareunia   Disposition:  A discussion was held with patient regarding the indication for her procedure(s) along with the risks, which include but are not limited to: reaction to anesthesia, damage to adjacent organs, infection and excessive bleeding.  The patient verbalized understanding of these risks and has consented to proceed with a Laparoscopic Left Oophorectomy at [facility] on September 16, 2018.   CSN# [id number]   [place] J. Florene Glen, PA-C  for [employer]. [facility]   Surgical procedure reviewed with patient.  Patient would like left ovary removed  whether cyst is still present or now d/t her cancer history.  R/B/A reviewed including but not limited to bleeding infection and injury.  Patient verbalized understanding and consent s/w.  Pt agreeable to laparotomy if needed.

## 2018-09-12 ENCOUNTER — Other Ambulatory Visit: Payer: Self-pay

## 2018-09-12 ENCOUNTER — Encounter (HOSPITAL_BASED_OUTPATIENT_CLINIC_OR_DEPARTMENT_OTHER): Payer: Self-pay | Admitting: *Deleted

## 2018-09-12 NOTE — Progress Notes (Signed)
Spoke with Sara Shannon after midnight food, clear liquids from midnight until 700 am then Shannon No meds to take Has lab for cbc and bmet and covid test appointment 230 pm 09-13-2018 Has surgery orders in epic Sister jennifer deloye driver cell [phone]

## 2018-09-13 ENCOUNTER — Encounter (HOSPITAL_COMMUNITY)
Admission: RE | Admit: 2018-09-13 | Discharge: 2018-09-13 | Disposition: A | Discharge status: Home / Self Care | Payer: Medicaid Other | Source: Ambulatory Visit | Attending: Obstetrics and Gynecology | Admitting: Obstetrics and Gynecology

## 2018-09-13 ENCOUNTER — Other Ambulatory Visit (HOSPITAL_COMMUNITY)
Admission: RE | Admit: 2018-09-13 | Discharge: 2018-09-13 | Disposition: A | Discharge status: Home / Self Care | Payer: Medicaid Other | Source: Ambulatory Visit | Attending: Obstetrics and Gynecology | Admitting: Obstetrics and Gynecology

## 2018-09-13 DIAGNOSIS — Z01812 Encounter for preprocedural laboratory examination: Secondary | ICD-10-CM | POA: Diagnosis not present

## 2018-09-13 DIAGNOSIS — Z1159 Encounter for screening for other viral diseases: Secondary | ICD-10-CM | POA: Insufficient documentation

## 2018-09-13 LAB — CBC
HCT: 40.9 % (ref 36.0–46.0)
Hemoglobin: 13.8 g/dL (ref 12.0–15.0)
MCH: 33.7 pg (ref 26.0–34.0)
MCHC: 33.7 g/dL (ref 30.0–36.0)
MCV: 100 fL (ref 80.0–100.0)
Platelets: 274 10*3/uL (ref 150–400)
RBC: 4.09 MIL/uL (ref 3.87–5.11)
RDW: 13.5 % (ref 11.5–15.5)
WBC: 6.5 10*3/uL (ref 4.0–10.5)
nRBC: 0 % (ref 0.0–0.2)

## 2018-09-13 LAB — BASIC METABOLIC PANEL
Anion gap: 6 (ref 5–15)
BUN: 12 mg/dL (ref 6–20)
CO2: 27 mmol/L (ref 22–32)
Calcium: 8.8 mg/dL — ABNORMAL LOW (ref 8.9–10.3)
Chloride: 107 mmol/L (ref 98–111)
Creatinine, Ser: 0.74 mg/dL (ref 0.44–1.00)
GFR calc Af Amer: >60 mL/min (ref >60)
GFR calc non Af Amer: >60 mL/min (ref >60)
Glucose, Bld: 89 mg/dL (ref 70–99)
Potassium: 3.5 mmol/L (ref 3.5–5.1)
Sodium: 140 mmol/L (ref 135–145)

## 2018-09-14 LAB — NOVEL CORONAVIRUS, NAA (HOSP ORDER, SEND-OUT TO REF LAB; TAT 18-24 HRS): SARS-CoV-2, NAA: NOT DETECTED

## 2018-09-15 NOTE — Progress Notes (Signed)
SPOKE W/  _ Nowthen 19:   COUGH-- no  RUNNY NOSE--- no  SORE THROAT---no  NASAL CONGESTION----no  SNEEZING----no  SHORTNESS OF BREATH---no  DIFFICULTY BREATHING--- no  TEMP >100.0 ----- no UNEXPLAINED BODY ACHES------ no  CHILLS -------- no  HEADACHES ---------no  LOSS OF SMELL/ TASTE -------- no   HAVE YOU OR ANY FAMILY MEMBER TRAVELLED PAST 14 DAYS OUT OF THE   COUNTY--- no STATE---- no COUNTRY----  HAVE YOU OR ANY FAMILY MEMBER BEEN EXPOSED TO ANYONE WITH COVID 19? no

## 2018-09-16 ENCOUNTER — Encounter (HOSPITAL_BASED_OUTPATIENT_CLINIC_OR_DEPARTMENT_OTHER): Payer: Self-pay

## 2018-09-16 ENCOUNTER — Ambulatory Visit (HOSPITAL_BASED_OUTPATIENT_CLINIC_OR_DEPARTMENT_OTHER)
Admission: RE | Admit: 2018-09-16 | Discharge: 2018-09-16 | Disposition: A | Discharge status: Home / Self Care | Payer: Medicaid Other | Attending: Obstetrics and Gynecology | Admitting: Obstetrics and Gynecology

## 2018-09-16 ENCOUNTER — Encounter (HOSPITAL_BASED_OUTPATIENT_CLINIC_OR_DEPARTMENT_OTHER)
Admission: RE | Disposition: A | Discharge status: Home / Self Care | Payer: Self-pay | Source: Home / Self Care | Attending: Obstetrics and Gynecology

## 2018-09-16 ENCOUNTER — Ambulatory Visit (HOSPITAL_BASED_OUTPATIENT_CLINIC_OR_DEPARTMENT_OTHER): Payer: Medicaid Other | Admitting: Anesthesiology

## 2018-09-16 DIAGNOSIS — K660 Peritoneal adhesions (postprocedural) (postinfection): Secondary | ICD-10-CM | POA: Diagnosis not present

## 2018-09-16 DIAGNOSIS — F419 Anxiety disorder, unspecified: Secondary | ICD-10-CM | POA: Diagnosis not present

## 2018-09-16 DIAGNOSIS — Z823 Family history of stroke: Secondary | ICD-10-CM | POA: Diagnosis not present

## 2018-09-16 DIAGNOSIS — E039 Hypothyroidism, unspecified: Secondary | ICD-10-CM | POA: Diagnosis not present

## 2018-09-16 DIAGNOSIS — K219 Gastro-esophageal reflux disease without esophagitis: Secondary | ICD-10-CM | POA: Insufficient documentation

## 2018-09-16 DIAGNOSIS — E559 Vitamin D deficiency, unspecified: Secondary | ICD-10-CM | POA: Diagnosis not present

## 2018-09-16 DIAGNOSIS — N8302 Follicular cyst of left ovary: Secondary | ICD-10-CM | POA: Diagnosis not present

## 2018-09-16 DIAGNOSIS — Z825 Family history of asthma and other chronic lower respiratory diseases: Secondary | ICD-10-CM | POA: Diagnosis not present

## 2018-09-16 DIAGNOSIS — F329 Major depressive disorder, single episode, unspecified: Secondary | ICD-10-CM | POA: Insufficient documentation

## 2018-09-16 DIAGNOSIS — Z8541 Personal history of malignant neoplasm of cervix uteri: Secondary | ICD-10-CM | POA: Diagnosis not present

## 2018-09-16 DIAGNOSIS — Z820 Family history of epilepsy and other diseases of the nervous system: Secondary | ICD-10-CM | POA: Diagnosis not present

## 2018-09-16 DIAGNOSIS — Z8249 Family history of ischemic heart disease and other diseases of the circulatory system: Secondary | ICD-10-CM | POA: Diagnosis not present

## 2018-09-16 DIAGNOSIS — F17210 Nicotine dependence, cigarettes, uncomplicated: Secondary | ICD-10-CM | POA: Insufficient documentation

## 2018-09-16 DIAGNOSIS — N83202 Unspecified ovarian cyst, left side: Secondary | ICD-10-CM | POA: Diagnosis present

## 2018-09-16 DIAGNOSIS — Z801 Family history of malignant neoplasm of trachea, bronchus and lung: Secondary | ICD-10-CM | POA: Diagnosis not present

## 2018-09-16 DIAGNOSIS — N8312 Corpus luteum cyst of left ovary: Secondary | ICD-10-CM | POA: Diagnosis not present

## 2018-09-16 HISTORY — DX: Other specified bacterial agents as the cause of diseases classified elsewhere: B96.89

## 2018-09-16 HISTORY — DX: Other specified bacterial agents as the cause of diseases classified elsewhere: N76.0

## 2018-09-16 SURGERY — OOPHORECTOMY, LAPAROSCOPIC
Anesthesia: General | Laterality: Left

## 2018-09-16 MED ORDER — MIDAZOLAM HCL 2 MG/2ML IJ SOLN
INTRAMUSCULAR | Status: AC
  Filled 2018-09-16: qty 2

## 2018-09-16 MED ORDER — KETOROLAC TROMETHAMINE 30 MG/ML IJ SOLN
INTRAMUSCULAR | Status: AC
  Filled 2018-09-16: qty 1

## 2018-09-16 MED ORDER — FENTANYL CITRATE (PF) 100 MCG/2ML IJ SOLN
INTRAMUSCULAR | Status: DC | PRN
  Administered 2018-09-16 (×2): 100 ug via INTRAVENOUS

## 2018-09-16 MED ORDER — LIDOCAINE 2% (20 MG/ML) 5 ML SYRINGE
INTRAMUSCULAR | Status: DC | PRN
  Administered 2018-09-16: 60 mg via INTRAVENOUS

## 2018-09-16 MED ORDER — PROPOFOL 10 MG/ML IV BOLUS
INTRAVENOUS | Status: DC | PRN
  Administered 2018-09-16: 130 mg via INTRAVENOUS

## 2018-09-16 MED ORDER — OXYCODONE HCL 5 MG PO TABS
5.0000 mg | ORAL_TABLET | Freq: Once | ORAL | Status: DC | PRN
  Filled 2018-09-16: qty 1

## 2018-09-16 MED ORDER — ONDANSETRON HCL 4 MG/2ML IJ SOLN
INTRAMUSCULAR | Status: DC | PRN
  Administered 2018-09-16: 4 mg via INTRAVENOUS

## 2018-09-16 MED ORDER — KETOROLAC TROMETHAMINE 30 MG/ML IJ SOLN: INTRAMUSCULAR | Status: DC | PRN

## 2018-09-16 MED ORDER — IBUPROFEN 600 MG PO TABS: ORAL_TABLET | ORAL | 1 refills | Status: AC

## 2018-09-16 MED ORDER — SUGAMMADEX SODIUM 200 MG/2ML IV SOLN
INTRAVENOUS | Status: DC | PRN
  Administered 2018-09-16: 200 mg via INTRAVENOUS

## 2018-09-16 MED ORDER — OXYCODONE-ACETAMINOPHEN 5-325 MG PO TABS: ORAL_TABLET | ORAL | 0 refills | Status: AC

## 2018-09-16 MED ORDER — FENTANYL CITRATE (PF) 100 MCG/2ML IJ SOLN
INTRAMUSCULAR | Status: AC
  Filled 2018-09-16: qty 2

## 2018-09-16 MED ORDER — ACETAMINOPHEN 325 MG PO TABS
ORAL_TABLET | ORAL | Status: DC | PRN
  Administered 2018-09-16: 1000 mg via ORAL

## 2018-09-16 MED ORDER — DEXAMETHASONE SODIUM PHOSPHATE 10 MG/ML IJ SOLN
INTRAMUSCULAR | Status: AC
  Filled 2018-09-16: qty 1

## 2018-09-16 MED ORDER — LIDOCAINE 2% (20 MG/ML) 5 ML SYRINGE
INTRAMUSCULAR | Status: AC
  Filled 2018-09-16: qty 5

## 2018-09-16 MED ORDER — ROCURONIUM BROMIDE 10 MG/ML (PF) SYRINGE
PREFILLED_SYRINGE | INTRAVENOUS | Status: DC | PRN
  Administered 2018-09-16: 50 mg via INTRAVENOUS

## 2018-09-16 MED ORDER — OXYCODONE HCL 5 MG/5ML PO SOLN
5.0000 mg | Freq: Once | ORAL | Status: DC | PRN
  Filled 2018-09-16: qty 5

## 2018-09-16 MED ORDER — SCOPOLAMINE 1 MG/3DAYS TD PT72
MEDICATED_PATCH | TRANSDERMAL | Status: DC | PRN
  Administered 2018-09-16: 1 via TRANSDERMAL

## 2018-09-16 MED ORDER — CEFAZOLIN SODIUM-DEXTROSE 2-4 GM/100ML-% IV SOLN
INTRAVENOUS | Status: AC
  Filled 2018-09-16: qty 100

## 2018-09-16 MED ORDER — BUPIVACAINE HCL (PF) 0.25 % IJ SOLN
INTRAMUSCULAR | Status: AC
  Filled 2018-09-16: qty 30

## 2018-09-16 MED ORDER — HYDROMORPHONE HCL 1 MG/ML IJ SOLN
0.2500 mg | INTRAMUSCULAR | Status: DC | PRN
  Administered 2018-09-16: 0.5 mg via INTRAVENOUS
  Filled 2018-09-16: qty 0.5

## 2018-09-16 MED ORDER — MEPERIDINE HCL 25 MG/ML IJ SOLN
6.2500 mg | INTRAMUSCULAR | Status: DC | PRN
  Filled 2018-09-16: qty 1

## 2018-09-16 MED ORDER — PROPOFOL 10 MG/ML IV BOLUS
INTRAVENOUS | Status: AC
  Filled 2018-09-16: qty 20

## 2018-09-16 MED ORDER — PROMETHAZINE HCL 25 MG/ML IJ SOLN
6.2500 mg | INTRAMUSCULAR | Status: DC | PRN
  Administered 2018-09-16: 6.25 mg via INTRAVENOUS
  Filled 2018-09-16: qty 1

## 2018-09-16 MED ORDER — BUPIVACAINE HCL (PF) 0.25 % IJ SOLN
INTRAMUSCULAR | Status: DC | PRN
  Administered 2018-09-16: 7 mL

## 2018-09-16 MED ORDER — MIDAZOLAM HCL 2 MG/2ML IJ SOLN
INTRAMUSCULAR | Status: DC | PRN
  Administered 2018-09-16: 2 mg via INTRAVENOUS

## 2018-09-16 MED ORDER — ACETAMINOPHEN 500 MG PO TABS
ORAL_TABLET | ORAL | Status: AC
  Filled 2018-09-16: qty 2

## 2018-09-16 MED ORDER — 0.9 % SODIUM CHLORIDE (POUR BTL) OPTIME
TOPICAL | Status: DC | PRN
  Administered 2018-09-16: 2000 mL

## 2018-09-16 MED ORDER — DEXAMETHASONE SODIUM PHOSPHATE 10 MG/ML IJ SOLN
INTRAMUSCULAR | Status: DC | PRN
  Administered 2018-09-16: 5 mg via INTRAVENOUS

## 2018-09-16 MED ORDER — ROCURONIUM BROMIDE 10 MG/ML (PF) SYRINGE
PREFILLED_SYRINGE | INTRAVENOUS | Status: AC
  Filled 2018-09-16: qty 10

## 2018-09-16 MED ORDER — HYDROMORPHONE HCL 1 MG/ML IJ SOLN
INTRAMUSCULAR | Status: AC
  Filled 2018-09-16: qty 1

## 2018-09-16 MED ORDER — PROMETHAZINE HCL 25 MG/ML IJ SOLN
INTRAMUSCULAR | Status: AC
  Filled 2018-09-16: qty 1

## 2018-09-16 MED ORDER — ONDANSETRON HCL 4 MG/2ML IJ SOLN
INTRAMUSCULAR | Status: AC
  Filled 2018-09-16: qty 2

## 2018-09-16 MED ORDER — ARTIFICIAL TEARS OPHTHALMIC OINT
TOPICAL_OINTMENT | OPHTHALMIC | Status: AC
  Filled 2018-09-16: qty 3.5

## 2018-09-16 MED ORDER — CEFAZOLIN SODIUM-DEXTROSE 2-4 GM/100ML-% IV SOLN
2.0000 g | INTRAVENOUS | Status: AC
  Administered 2018-09-16: 2 g via INTRAVENOUS
  Filled 2018-09-16: qty 100

## 2018-09-16 MED ORDER — SCOPOLAMINE 1 MG/3DAYS TD PT72
MEDICATED_PATCH | TRANSDERMAL | Status: AC
  Filled 2018-09-16: qty 1

## 2018-09-16 MED ORDER — SUGAMMADEX SODIUM 200 MG/2ML IV SOLN
INTRAVENOUS | Status: AC
  Filled 2018-09-16: qty 2

## 2018-09-16 MED ORDER — LACTATED RINGERS IV SOLN
INTRAVENOUS | Status: DC
  Administered 2018-09-16 (×2): via INTRAVENOUS
  Filled 2018-09-16: qty 1000

## 2018-09-16 SURGICAL SUPPLY — 44 items
ADH SKN CLS APL DERMABOND .7 (GAUZE/BANDAGES/DRESSINGS) ×1
APL PRP STRL LF DISP 70% ISPRP (MISCELLANEOUS) ×1
APL SRG 38 LTWT LNG FL B (MISCELLANEOUS) ×1
APPLICATOR ARISTA FLEXITIP XL (MISCELLANEOUS) ×2 IMPLANT
APPLIER CLIP LOGIC TI 5 (MISCELLANEOUS) ×2 IMPLANT
APR CLP MED LRG 33X5 (MISCELLANEOUS) ×1
BAG SPEC RTRVL LRG 6X4 10 (ENDOMECHANICALS) ×1
CABLE HIGH FREQUENCY MONO STRZ (ELECTRODE) IMPLANT
CHLORAPREP W/TINT 26 (MISCELLANEOUS) ×2 IMPLANT
DERMABOND ADVANCED (GAUZE/BANDAGES/DRESSINGS) ×2
DERMABOND ADVANCED .7 DNX12 (GAUZE/BANDAGES/DRESSINGS) ×1 IMPLANT
DISSECTOR BLUNT TIP ENDO 5MM (MISCELLANEOUS) ×4 IMPLANT
DRSG OPSITE POSTOP 3X4 (GAUZE/BANDAGES/DRESSINGS) ×3 IMPLANT
DURAPREP 26ML APPLICATOR (WOUND CARE) ×3 IMPLANT
FORCEPS CUTTING 33CM 5MM (CUTTING FORCEPS) ×2 IMPLANT
GLOVE BIO SURGEON STRL SZ7.5 (GLOVE) ×3 IMPLANT
GLOVE BIOGEL PI IND STRL 7.0 (GLOVE) ×2 IMPLANT
GLOVE BIOGEL PI IND STRL 7.5 (GLOVE) ×2 IMPLANT
GLOVE BIOGEL PI INDICATOR 7.0 (GLOVE) ×4
GLOVE BIOGEL PI INDICATOR 7.5 (GLOVE) ×4
GOWN STRL REUS W/ TWL LRG LVL3 (GOWN DISPOSABLE) ×2 IMPLANT
GOWN STRL REUS W/TWL LRG LVL3 (GOWN DISPOSABLE) ×8 IMPLANT
HEMOSTAT ARISTA ABSORB 3G PWDR (HEMOSTASIS) ×2 IMPLANT
HIBICLENS CHG 4% 4OZ BTL (MISCELLANEOUS) ×3 IMPLANT
NEEDLE INSUFFLATION 120MM (ENDOMECHANICALS) ×3 IMPLANT
NS IRRIG 1000ML POUR BTL (IV SOLUTION) IMPLANT
PACK LAPAROSCOPY BASIN (CUSTOM PROCEDURE TRAY) ×3 IMPLANT
PACK TRENDGUARD 450 HYBRID PRO (MISCELLANEOUS) IMPLANT
POUCH SPECIMEN RETRIEVAL 10MM (ENDOMECHANICALS) ×2 IMPLANT
SET IRRIG TUBING LAPAROSCOPIC (IRRIGATION / IRRIGATOR) ×2 IMPLANT
SHEARS HARMONIC ACE PLUS 36CM (ENDOMECHANICALS) IMPLANT
SLEEVE XCEL OPT CAN 5 100 (ENDOMECHANICALS) IMPLANT
SOLUTION ELECTROLUBE (MISCELLANEOUS) ×2 IMPLANT
SUT MNCRL AB 3-0 PS2 27 (SUTURE) ×5 IMPLANT
SUT VICRYL 0 ENDOLOOP (SUTURE) IMPLANT
SUT VICRYL 0 TIES 12 18 (SUTURE) IMPLANT
SUT VICRYL 0 UR6 27IN ABS (SUTURE) ×3 IMPLANT
SUT VICRYL 4-0 PS2 18IN ABS (SUTURE) IMPLANT
SYR 50ML LL SCALE MARK (SYRINGE) ×3 IMPLANT
TRAY FOLEY W/BAG SLVR 14FR LF (SET/KITS/TRAYS/PACK) ×4 IMPLANT
TRENDGUARD 450 HYBRID PRO PACK (MISCELLANEOUS) ×3
TROCAR XCEL NON-BLD 11X100MML (ENDOMECHANICALS) ×3 IMPLANT
TROCAR XCEL NON-BLD 5MMX100MML (ENDOMECHANICALS) ×3 IMPLANT
WARMER LAPAROSCOPE (MISCELLANEOUS) ×3 IMPLANT

## 2018-09-16 NOTE — Anesthesia Preprocedure Evaluation (Addendum)
Anesthesia Evaluation  Patient identified by MRN, date of birth, ID band Patient awake    Reviewed: Allergy & Precautions, NPO status , Patient's Chart, lab work & pertinent test results  Airway Mallampati: II  TM Distance: >3 FB Neck ROM: Full    Dental  (+) Teeth Intact, Dental Advisory Given   Pulmonary Current Smoker,    Pulmonary exam normal breath sounds clear to auscultation       Cardiovascular negative cardio ROS Normal cardiovascular exam Rhythm:Regular Rate:Normal     Neuro/Psych PSYCHIATRIC DISORDERS Anxiety Depression negative neurological ROS     GI/Hepatic Neg liver ROS, GERD  Medicated,  Endo/Other  Hypothyroidism   Renal/GU negative Renal ROS     Musculoskeletal   Abdominal   Peds  Hematology negative hematology ROS (+)   Anesthesia Other Findings   Reproductive/Obstetrics                             Lab Results  Component Value Date   WBC 6.5 09/13/2018   HGB 13.8 09/13/2018   HCT 40.9 09/13/2018   MCV 100.0 09/13/2018   PLT 274 09/13/2018   Lab Results  Component Value Date   CREATININE 0.74 09/13/2018   BUN 12 09/13/2018   NA 140 09/13/2018   K 3.5 09/13/2018   CL 107 09/13/2018   CO2 27 09/13/2018    Anesthesia Physical  Anesthesia Plan  ASA: II  Anesthesia Plan: General   Post-op Pain Management:    Induction: Intravenous  PONV Risk Score and Plan: 2 and Midazolam and Ondansetron  Airway Management Planned: Oral ETT  Additional Equipment:   Intra-op Plan:   Post-operative Plan: Extubation in OR  Informed Consent: I have reviewed the patients History and Physical, chart, labs and discussed the procedure including the risks, benefits and alternatives for the proposed anesthesia with the patient or authorized representative who has indicated his/her understanding and acceptance.     Dental advisory given  Plan Discussed with:  CRNA  Anesthesia Plan Comments:         Anesthesia Quick Evaluation

## 2018-09-16 NOTE — Transfer of Care (Signed)
Immediate Anesthesia Transfer of Care Note  Patient: Sara Shannon  Procedure(s) Performed: LAPAROSCOPIC OOPHORECTOMY (Left )  Patient Location: PACU  Anesthesia Type:General  Level of Consciousness: awake, alert , oriented and patient cooperative  Airway & Oxygen Therapy: Patient Spontanous Breathing and Patient connected to nasal cannula oxygen  Post-op Assessment: Report given to RN and Post -op Vital signs reviewed and stable  Post vital signs: Reviewed and stable  Last Vitals:  Vitals Value Taken Time  BP    Temp    Pulse 54 09/16/18 1506  Resp    SpO2 100 % 09/16/18 1506  Vitals shown include unvalidated device data.  Last Pain:  Vitals:   09/16/18 1104  TempSrc: Oral  PainSc: 0-No pain      Patients Stated Pain Goal: 5 (68/12/75 1700)  Complications: No apparent anesthesia complications

## 2018-09-16 NOTE — Discharge Instructions (Signed)
Call [facility] @ [phone] if:  You have a temperature greater than or equal to 100.4 degrees Farenheit orally You have pain that is not made better by the pain medication given and taken as directed You have excessive bleeding or problems urinating  Take Colace (Docusate Sodium/Stool Softener) 100 mg 2-3 times daily while taking narcotic pain medicine to avoid constipation or until bowel movements are regular. Take, with food,  Ibuprofen 600 mg every 6 hours for 5 days then as needed for pain  You may drive after 24 hours You may walk up steps  You may shower tomorrow You may resume a regular diet  Keep incisions clean and dry Do not lift over 15 pounds for 6 weeks Avoid anything in vagina until after your post-operative visit  Post Anesthesia Home Care Instructions  Activity: Get plenty of rest for the remainder of the day. A responsible individual must stay with you for 24 hours following the procedure.  For the next 24 hours, DO NOT: -Drive a car -Paediatric nurse -Drink alcoholic beverages -Take any medication unless instructed by your physician -Make any legal decisions or sign important papers.  Meals: Start with liquid foods such as gelatin or soup. Progress to regular foods as tolerated. Avoid greasy, spicy, heavy foods. If nausea and/or vomiting occur, drink only clear liquids until the nausea and/or vomiting subsides. Call your physician if vomiting continues.  Special Instructions/Symptoms: Your throat may feel dry or sore from the anesthesia or the breathing tube placed in your throat during surgery. If this causes discomfort, gargle with warm salt water. The discomfort should disappear within 24 hours.  If you had a scopolamine patch placed behind your ear for the management of post- operative nausea and/or vomiting:  1. The medication in the patch is effective for 72 hours, after which it should be removed.  Wrap patch in a tissue and discard in  the trash. Wash hands thoroughly with soap and water. 2. You may remove the patch earlier than 72 hours if you experience unpleasant side effects which may include dry mouth, dizziness or visual disturbances. 3. Avoid touching the patch. Wash your hands with soap and water after contact with the patch.

## 2018-09-16 NOTE — Op Note (Addendum)
Preop Diagnosis: Left Ovarian Cyst   Postop Diagnosis: 1.Left Ovarian Cyst 2.Adhesions  Procedure: 1.LAPAROSCOPIC LEFT OOPHORECTOMY 2.LOA  Anesthesia: General   Anesthesiologist: Lynda Rainwater, MD   Attending: Everett Graff, MD   Assistant:  E. Florene Glen, PA-C  Findings: Left descending colon/sigmoid colon adhesions to the left adnexa.  Nl appearing rt ovary.  No obvious adhesions at the cuff on the right or left side.  Blood was noted to ooze at one point during the case from the ovary which may have been rupturing of hemorrhagic cyst.  Adhesions made it difficult to truly assess as it happened.  Pathology: Left ovary  Fluids: 1100 cc  UOP: 200 cc  EBL: 50 cc  Complications: None  Procedure:  The patient was taken to the operating room after the risks, benefits, alternatives, complications, treatment options, and expected outcomes were discussed with the patient. The patient verbalized understanding, the patient concurred with the proposed plan and consent signed and witnessed. The patient was taken to the Operating Room and identified as [place] and the procedure verified as laparoscopic left oophorectomy.  The patient was placed under general anesthesia per anesthesia staff, the patient was placed in modified dorsal lithotomy position and was prepped, draped, and catheterized in the normal, sterile fashion.  A Time Out was held and the above information confirmed.  A sponge stick was placed in the patient's vagina.  A 10 mm umbilical incision was then performed. Veress needle was placed and pneumoperitoneum was established. A 10 mm trocar was advanced into the intraabdominal cavity, the laparoscope was introduced and findings as noted above.  Patient was placed in trendelenburg and marcaine injected in the LLQ and a 5 mm incision was made and 5 mm trocar advanced into the intraabdominal cavity.  The same was done in the RLQ and a 10 mm in the suprapubic area.    Adhesions over the bowel and to the right ovary were sharply and bluntly dissected away until left ovary was visualized.  The infundibulopelvic was cauterized right up against the ovary as the bowel was right below it.  Cautery was extended up on to the ovary.  The ovary was then excised immediately at its base.  The ureter was identified on that side and peristalsis noted.  Three hemostatic clips were placed at the base of the ovary where there was some oozing right over the bowel where adhesions had been removed with the peanut.  Good hemostasis was noted except for a pinpoint area over the bowel.  Hemostatic powder was placed over the pedicle.  Pneumo was relieved for 1.5 minutes and no oozing from that area noted after insufflation.  Copious irrigation was performed prior to placing hemostatic powder.  The 10 mm suprapubic trocar was removed and fascia repaired with 0 vicryl.  Right and left trocars were removed as well under direct visualization.  Pneumoperitoneum was relieved and umbilical trocar removed under direct visualization.  The umbilical fascia was repaired with 0 vicryl.  The 10 mm skin incisions were repaired with 3-0 monocryl via a subcuticular stitch and dermabond was applied to all incisions.  Sponge, instrument, lap and needle counts were correct.  The patient tolerated the procedure well and was awaiting extubation and transfer to the recovery room.

## 2018-09-16 NOTE — Anesthesia Procedure Notes (Signed)
Procedure Name: Intubation Date/Time: 09/16/2018 1:17 PM Performed by: Wanita Chamberlain, CRNA Pre-anesthesia Checklist: Patient identified, Emergency Drugs available, Suction available and Patient being monitored Patient Re-evaluated:Patient Re-evaluated prior to induction Oxygen Delivery Method: Circle system utilized Preoxygenation: Pre-oxygenation with 100% oxygen Induction Type: IV induction Ventilation: Mask ventilation without difficulty Laryngoscope Size: Mac and 3 Grade View: Grade I Tube type: Oral Tube size: 7.0 mm Number of attempts: 1 Placement Confirmation: breath sounds checked- equal and bilateral,  CO2 detector,  positive ETCO2 and ETT inserted through vocal cords under direct vision Secured at: 22 cm Tube secured with: Tape Dental Injury: Teeth and Oropharynx as per pre-operative assessment

## 2018-09-19 NOTE — Anesthesia Postprocedure Evaluation (Signed)
Anesthesia Post Note  Patient: Sara Shannon  Procedure(s) Performed: LAPAROSCOPIC OOPHORECTOMY (Left )     Patient location during evaluation: PACU Anesthesia Type: General Level of consciousness: awake and alert Pain management: pain level controlled Vital Signs Assessment: post-procedure vital signs reviewed and stable Respiratory status: spontaneous breathing, nonlabored ventilation and respiratory function stable Cardiovascular status: blood pressure returned to baseline and stable Postop Assessment: no apparent nausea or vomiting Anesthetic complications: no    Last Vitals:  Vitals:   09/16/18 1630 09/16/18 1715  BP: 110/66 115/70  Pulse: (!) 43 (!) 58  Resp: 14 18  Temp:    SpO2: 99% 100%    Last Pain:  Vitals:   09/16/18 1630  TempSrc:   PainSc: [place]

## 2018-09-20 ENCOUNTER — Other Ambulatory Visit: Payer: Self-pay | Admitting: Obstetrics and Gynecology

## 2018-09-20 ENCOUNTER — Ambulatory Visit
Admission: RE | Admit: 2018-09-20 | Discharge: 2018-09-20 | Disposition: A | Discharge status: Home / Self Care | Payer: Medicaid Other | Source: Ambulatory Visit | Attending: Obstetrics and Gynecology | Admitting: Obstetrics and Gynecology

## 2018-09-20 DIAGNOSIS — R140 Abdominal distension (gaseous): Secondary | ICD-10-CM

## 2018-09-20 DIAGNOSIS — R109 Unspecified abdominal pain: Secondary | ICD-10-CM

## 2018-12-15 IMAGING — US US TRANSVAGINAL NON-OB
1 series · 13 of 25 positions shown · non-contrast
Comparison: CT performed today

CLINICAL DATA: 37-year-old female with pelvic pain and vaginal
discharge following hysterectomy on 05/11/2017.

EXAM:
TRANSABDOMINAL AND TRANSVAGINAL ULTRASOUND OF PELVIS
TECHNIQUE: Both transabdominal and transvaginal ultrasound examinations of the
pelvis were performed. Transabdominal technique was performed for
global imaging of the pelvis including ovaries, adnexal regions, and
pelvic cul-de-sac. It was necessary to proceed with endovaginal exam
following the transabdominal exam to visualize the ovaries and
adnexal regions.

[Series 1: us transvaginal non-ob · 0.19mm/px · 35 acquisitions, 13 frames shown]
[im 1/35]
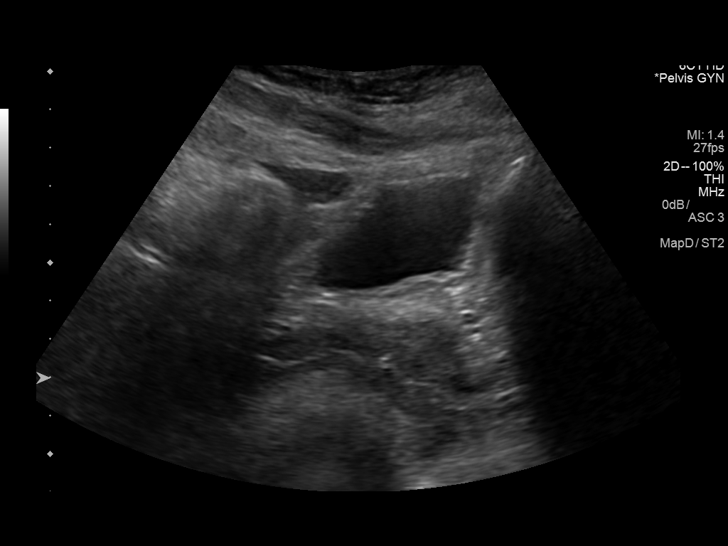
[im 3/35]
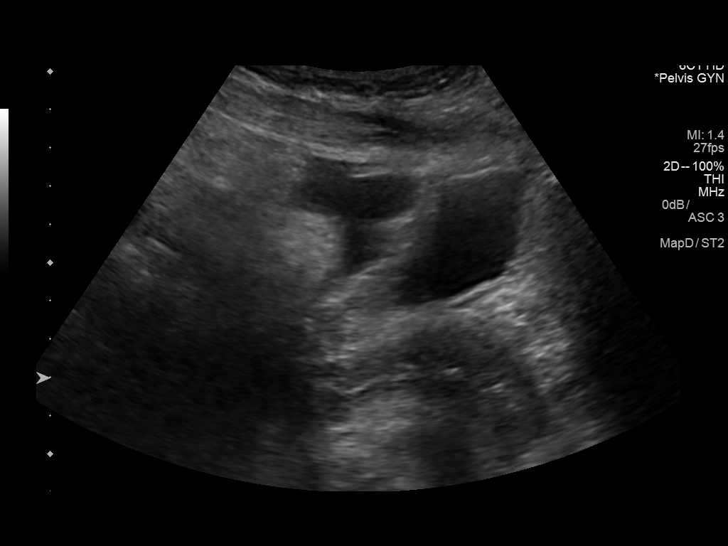
[im 6/35]
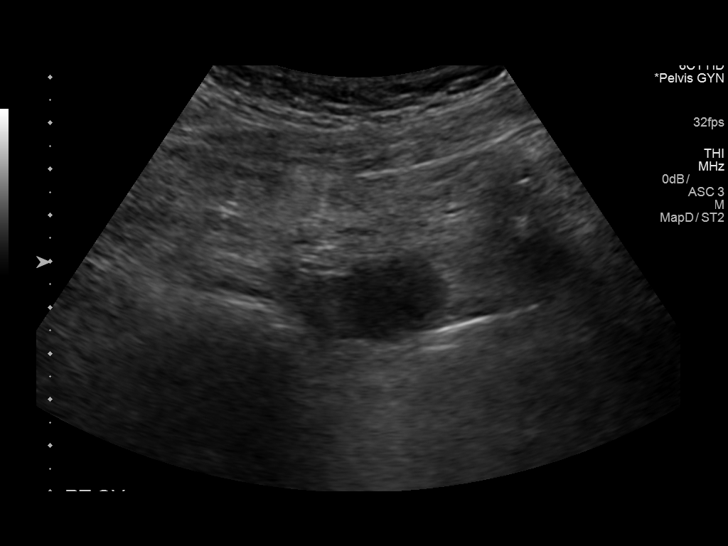
[im 9/35]
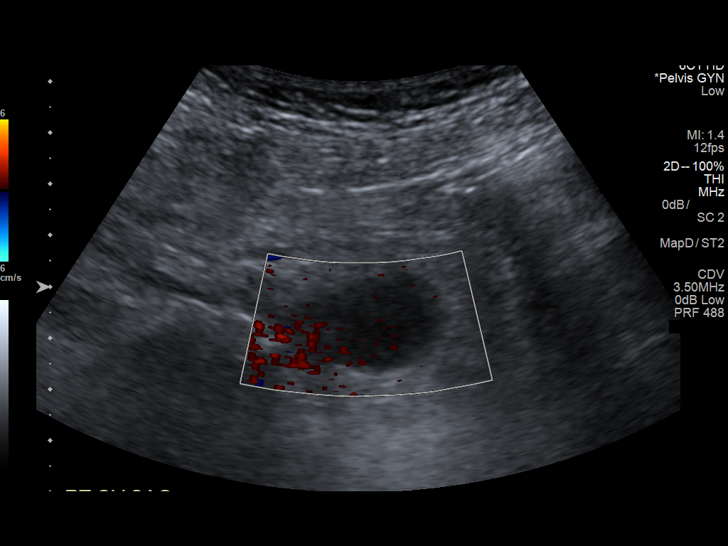
[im 12/35]
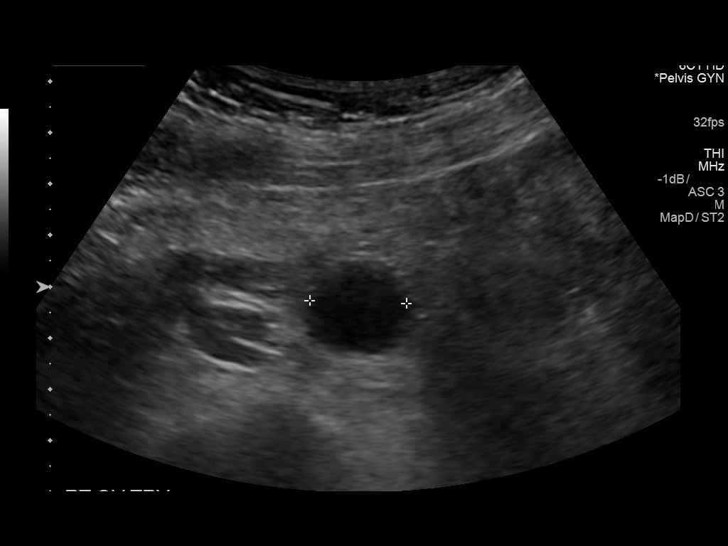
[im 15/35]
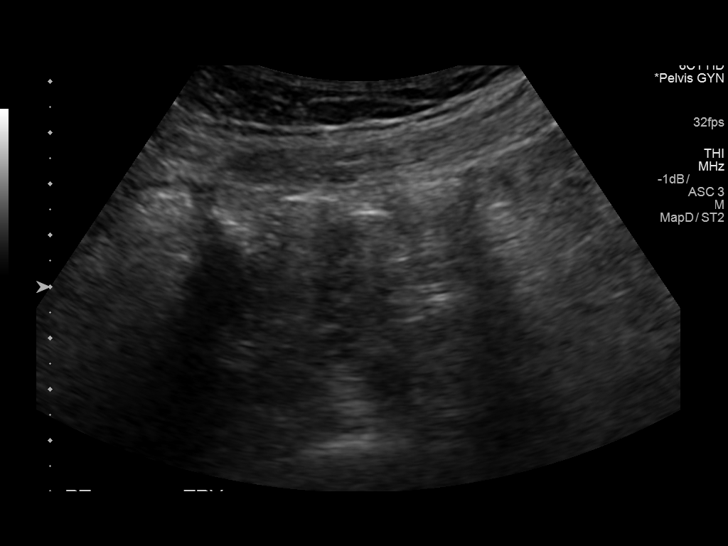
[im 18/35]
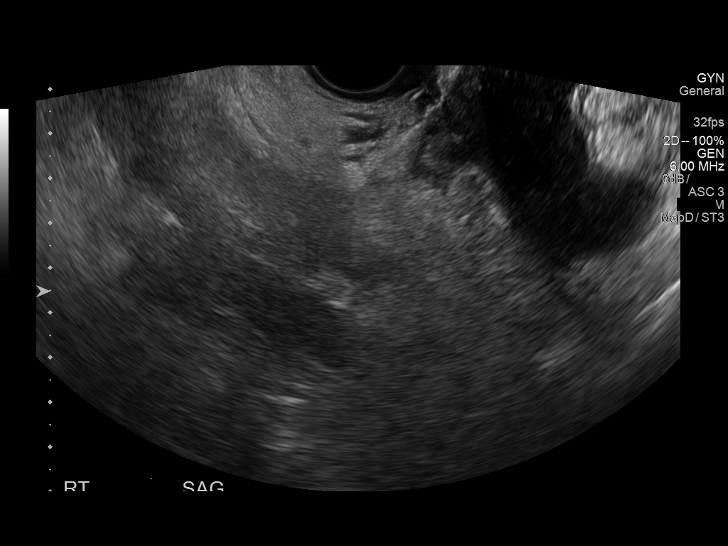
[im 20/35]
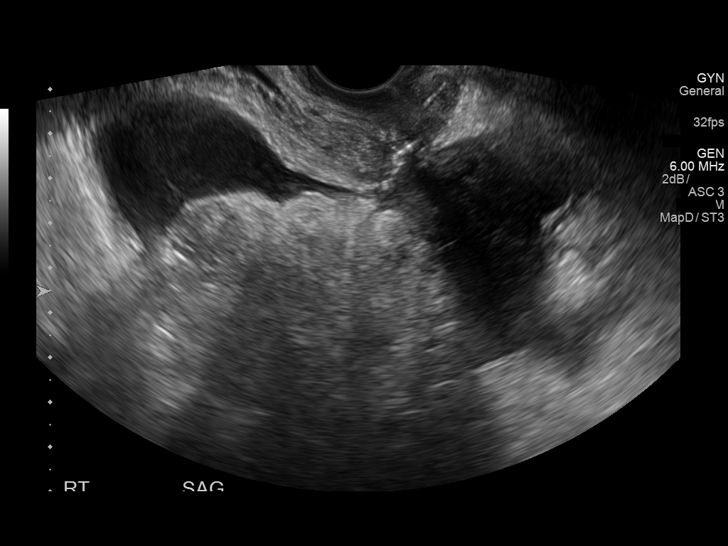
[im 23/35]
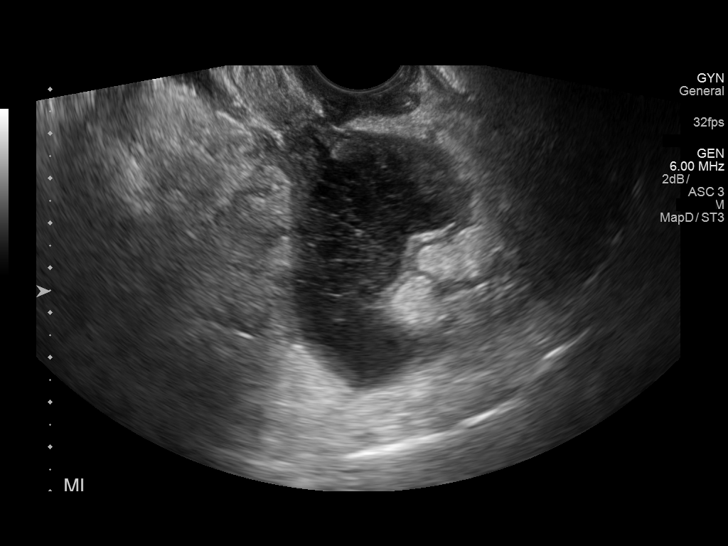
[im 26/35]
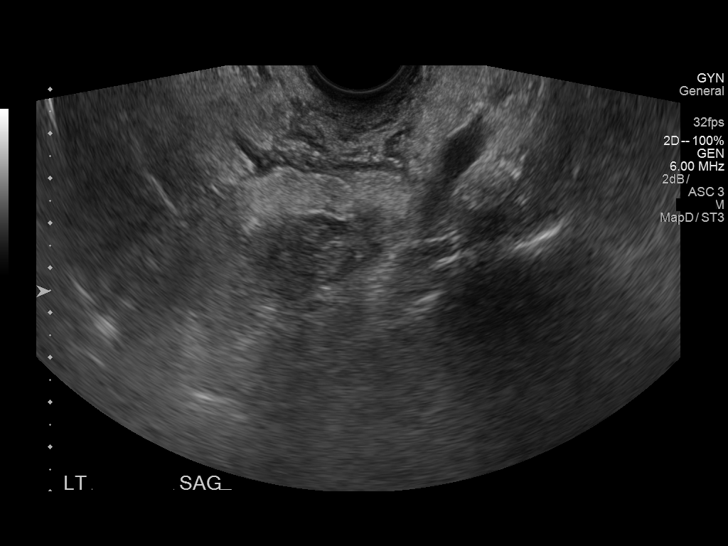
[im 29/35]
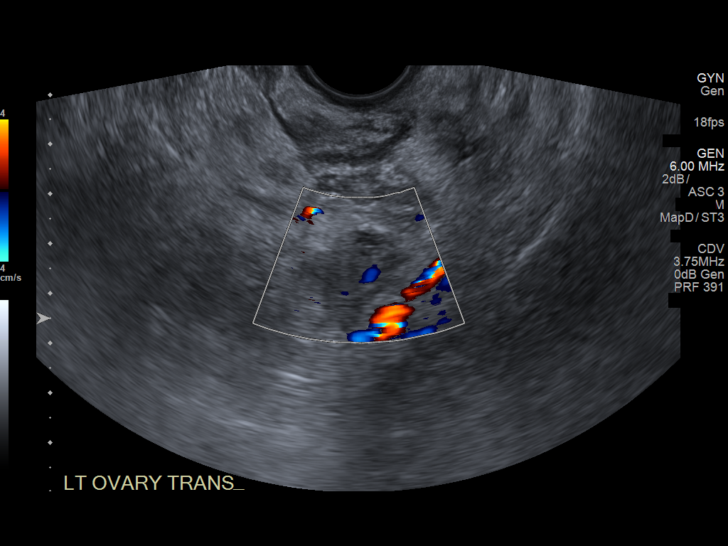
[im 32/35]
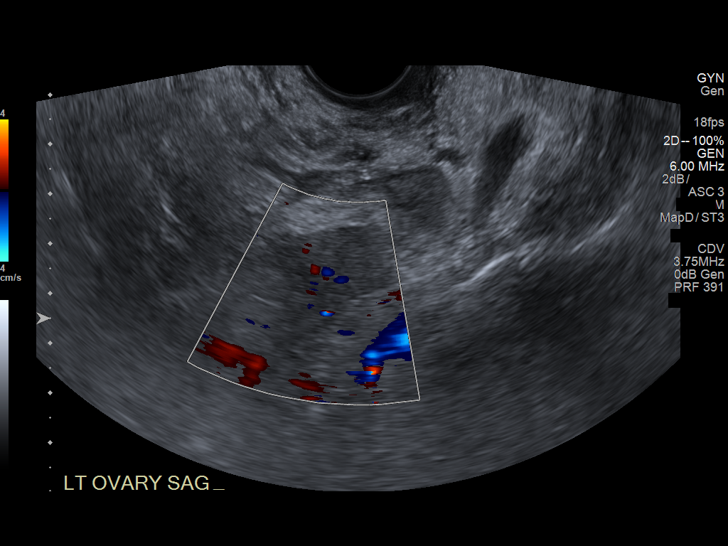
[im 35/35]
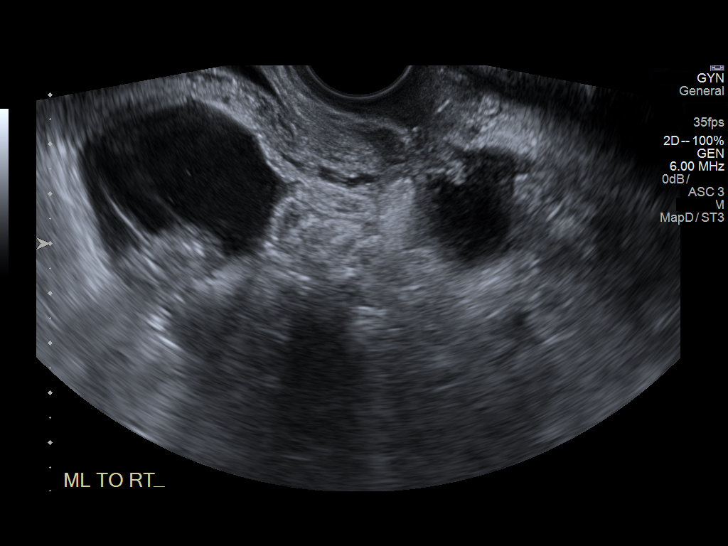

[13 of 25 positions shown; findings below may reference images not displayed]

FINDINGS: Uterus

Not visualized compatible with hysterectomy.

Right ovary

Measurements: 3.5 x 2 x 2.5 cm.  Normal appearance/no adnexal mass.

Left ovary

Measurements: 3.6 x 1.8 x 1.8 cm.. Normal appearance/no adnexal
mass.

Other findings

A 3.5 x 5.5 cm complex collection in the hysterectomy bed noted
which may represent a postoperative collection or abscess.

A small amount of free pelvic fluid is noted.
IMPRESSION: 1. 3.5 x 5.5 cm complex collection in the hysterectomy bed-question
postoperative collection/seroma or abscess.
2. Small amount of free pelvic fluid
3. Status post hysterectomy.
4. Normal ovaries.
